# Patient Record
Sex: Female | Born: 1935 | Race: White | Hispanic: No | Smoking: Never smoker
Health system: Southern US, Community
[De-identification: ages and names within clinical notes are randomized; demographics above are authoritative.]

## PROBLEM LIST (undated history)

## (undated) DIAGNOSIS — I639 Cerebral infarction, unspecified: Secondary | ICD-10-CM

## (undated) DIAGNOSIS — R609 Edema, unspecified: Secondary | ICD-10-CM

## (undated) DIAGNOSIS — I10 Essential (primary) hypertension: Secondary | ICD-10-CM

## (undated) DIAGNOSIS — R600 Localized edema: Secondary | ICD-10-CM

## (undated) DIAGNOSIS — I209 Angina pectoris, unspecified: Secondary | ICD-10-CM

---

## 2004-05-15 ENCOUNTER — Ambulatory Visit (HOSPITAL_COMMUNITY): Admission: RE | Admit: 2004-05-15 | Discharge: 2004-05-15 | Payer: Self-pay | Admitting: Family Medicine

## 2006-04-15 ENCOUNTER — Ambulatory Visit (HOSPITAL_COMMUNITY): Admission: RE | Admit: 2006-04-15 | Discharge: 2006-04-15 | Payer: Self-pay | Admitting: Family Medicine

## 2008-05-26 ENCOUNTER — Encounter (HOSPITAL_COMMUNITY): Admission: RE | Admit: 2008-05-26 | Discharge: 2008-06-25 | Payer: Self-pay | Admitting: Internal Medicine

## 2010-08-30 ENCOUNTER — Ambulatory Visit: Payer: Self-pay | Admitting: Cardiology

## 2010-08-30 ENCOUNTER — Inpatient Hospital Stay (HOSPITAL_COMMUNITY): Admission: EM | Admit: 2010-08-30 | Discharge: 2010-09-01 | Payer: Self-pay | Admitting: Emergency Medicine

## 2010-09-01 ENCOUNTER — Encounter (INDEPENDENT_AMBULATORY_CARE_PROVIDER_SITE_OTHER): Payer: Self-pay | Admitting: Internal Medicine

## 2011-03-08 LAB — COMPREHENSIVE METABOLIC PANEL
ALT: 24 U/L (ref 0–35)
AST: 32 U/L (ref 0–37)
Alkaline Phosphatase: 87 U/L (ref 39–117)
BUN: 17 mg/dL (ref 6–23)
CO2: 32 mEq/L (ref 19–32)
Calcium: 8.5 mg/dL (ref 8.4–10.5)
Calcium: 8.9 mg/dL (ref 8.4–10.5)
Chloride: 103 mEq/L (ref 96–112)
GFR calc Af Amer: >60 mL/min (ref >60)
GFR calc Af Amer: >60 mL/min (ref >60)
Glucose, Bld: 135 mg/dL — ABNORMAL HIGH (ref 70–99)
Total Bilirubin: 1.1 mg/dL (ref 0.3–1.2)
Total Protein: 6.1 g/dL (ref 6.0–8.3)
Total Protein: 7 g/dL (ref 6.0–8.3)

## 2011-03-08 LAB — DIFFERENTIAL
Basophils Relative: 0 % (ref 0–1)
Eosinophils Absolute: 0.1 10*3/uL (ref 0.0–0.7)
Eosinophils Absolute: 0.1 10*3/uL (ref 0.0–0.7)
Eosinophils Relative: 2 % (ref 0–5)
Lymphocytes Relative: 32 % (ref 12–46)
Lymphocytes Relative: 33 % (ref 12–46)
Lymphs Abs: 1.4 10*3/uL (ref 0.7–4.0)
Lymphs Abs: 1.5 10*3/uL (ref 0.7–4.0)
Monocytes Relative: 11 % (ref 3–12)
Monocytes Relative: 9 % (ref 3–12)
Neutro Abs: 2.3 10*3/uL (ref 1.7–7.7)
Neutro Abs: 2.4 10*3/uL (ref 1.7–7.7)
Neutro Abs: 4.5 10*3/uL (ref 1.7–7.7)

## 2011-03-08 LAB — BASIC METABOLIC PANEL
CO2: 35 mEq/L — ABNORMAL HIGH (ref 19–32)
Calcium: 8.5 mg/dL (ref 8.4–10.5)
Chloride: 100 mEq/L (ref 96–112)
Creatinine, Ser: 0.83 mg/dL (ref 0.4–1.2)
GFR calc non Af Amer: >60 mL/min (ref >60)
Glucose, Bld: 103 mg/dL — ABNORMAL HIGH (ref 70–99)
Potassium: 4.1 mEq/L (ref 3.5–5.1)
Sodium: 141 mEq/L (ref 135–145)

## 2011-03-08 LAB — CARDIAC PANEL(CRET KIN+CKTOT+MB+TROPI)
CK, MB: 2.4 ng/mL (ref 0.3–4.0)
Relative Index: INVALID (ref 0.0–2.5)
Total CK: 31 U/L (ref 7–177)
Total CK: 40 U/L (ref 7–177)
Troponin I: 0.02 ng/mL (ref 0.00–0.06)

## 2011-03-08 LAB — URINALYSIS, ROUTINE W REFLEX MICROSCOPIC
Bilirubin Urine: NEGATIVE
Hgb urine dipstick: NEGATIVE
Ketones, ur: NEGATIVE mg/dL
Leukocytes, UA: NEGATIVE
Specific Gravity, Urine: 1.02 (ref 1.005–1.030)
pH: 5.5 (ref 5.0–8.0)

## 2011-03-08 LAB — CBC
HCT: 38.2 % (ref 36.0–46.0)
Hemoglobin: 11.6 g/dL — ABNORMAL LOW (ref 12.0–15.0)
Hemoglobin: 13 g/dL (ref 12.0–15.0)
MCH: 30.6 pg (ref 26.0–34.0)
MCHC: 34.2 g/dL (ref 30.0–36.0)
MCV: 89.5 fL (ref 78.0–100.0)
MCV: 90.5 fL (ref 78.0–100.0)
Platelets: 129 10*3/uL — ABNORMAL LOW (ref 150–400)
RBC: 3.84 MIL/uL — ABNORMAL LOW (ref 3.87–5.11)
RBC: 4.22 MIL/uL (ref 3.87–5.11)
WBC: 6.6 10*3/uL (ref 4.0–10.5)

## 2011-03-08 LAB — POCT CARDIAC MARKERS: Troponin i, poc: <0.05 ng/mL (ref 0.00–0.09)

## 2011-03-08 LAB — VANCOMYCIN, TROUGH: Vancomycin Tr: 18.1 ug/mL (ref 10.0–20.0)

## 2014-08-22 ENCOUNTER — Emergency Department (HOSPITAL_COMMUNITY): Payer: Medicare Other

## 2014-08-22 ENCOUNTER — Encounter (HOSPITAL_COMMUNITY): Payer: Self-pay | Admitting: Emergency Medicine

## 2014-08-22 ENCOUNTER — Inpatient Hospital Stay (HOSPITAL_COMMUNITY)
Admission: EM | Admit: 2014-08-22 | Discharge: 2014-08-24 | DRG: 066 | Disposition: A | Discharge status: Home Health | Payer: Medicare Other | Attending: Internal Medicine | Admitting: Internal Medicine

## 2014-08-22 DIAGNOSIS — F0390 Unspecified dementia without behavioral disturbance: Secondary | ICD-10-CM | POA: Diagnosis present

## 2014-08-22 DIAGNOSIS — I10 Essential (primary) hypertension: Secondary | ICD-10-CM | POA: Diagnosis present

## 2014-08-22 DIAGNOSIS — F4000 Agoraphobia, unspecified: Secondary | ICD-10-CM | POA: Diagnosis present

## 2014-08-22 DIAGNOSIS — G459 Transient cerebral ischemic attack, unspecified: Secondary | ICD-10-CM | POA: Diagnosis present

## 2014-08-22 DIAGNOSIS — I6350 Cerebral infarction due to unspecified occlusion or stenosis of unspecified cerebral artery: Principal | ICD-10-CM | POA: Diagnosis present

## 2014-08-22 DIAGNOSIS — R4182 Altered mental status, unspecified: Secondary | ICD-10-CM | POA: Diagnosis not present

## 2014-08-22 DIAGNOSIS — F411 Generalized anxiety disorder: Secondary | ICD-10-CM | POA: Diagnosis present

## 2014-08-22 DIAGNOSIS — I6340 Cerebral infarction due to embolism of unspecified cerebral artery: Secondary | ICD-10-CM | POA: Diagnosis present

## 2014-08-22 DIAGNOSIS — R1310 Dysphagia, unspecified: Secondary | ICD-10-CM | POA: Diagnosis present

## 2014-08-22 DIAGNOSIS — R600 Localized edema: Secondary | ICD-10-CM | POA: Diagnosis present

## 2014-08-22 DIAGNOSIS — I6389 Other cerebral infarction: Secondary | ICD-10-CM | POA: Diagnosis present

## 2014-08-22 DIAGNOSIS — R609 Edema, unspecified: Secondary | ICD-10-CM

## 2014-08-22 DIAGNOSIS — I4891 Unspecified atrial fibrillation: Secondary | ICD-10-CM | POA: Diagnosis present

## 2014-08-22 DIAGNOSIS — R4701 Aphasia: Secondary | ICD-10-CM | POA: Diagnosis present

## 2014-08-22 HISTORY — DX: Angina pectoris, unspecified: I20.9

## 2014-08-22 HISTORY — DX: Edema, unspecified: R60.9

## 2014-08-22 HISTORY — DX: Essential (primary) hypertension: I10

## 2014-08-22 HISTORY — DX: Localized edema: R60.0

## 2014-08-22 LAB — DIFFERENTIAL
Basophils Absolute: 0 10*3/uL (ref 0.0–0.1)
Basophils Relative: 0 % (ref 0–1)
Eosinophils Absolute: 0 10*3/uL (ref 0.0–0.7)
Eosinophils Relative: 1 % (ref 0–5)
Lymphocytes Relative: 41 % (ref 12–46)
Lymphs Abs: 1.7 10*3/uL (ref 0.7–4.0)
MONO ABS: 0.4 10*3/uL (ref 0.1–1.0)
MONOS PCT: 10 % (ref 3–12)
Neutro Abs: 2 10*3/uL (ref 1.7–7.7)
Neutrophils Relative %: 48 % (ref 43–77)

## 2014-08-22 LAB — RAPID URINE DRUG SCREEN, HOSP PERFORMED
AMPHETAMINES: NOT DETECTED
Barbiturates: NOT DETECTED
Benzodiazepines: POSITIVE — AB
COCAINE: NOT DETECTED
Opiates: NOT DETECTED
TETRAHYDROCANNABINOL: NOT DETECTED

## 2014-08-22 LAB — URINALYSIS, ROUTINE W REFLEX MICROSCOPIC
BILIRUBIN URINE: NEGATIVE
Glucose, UA: NEGATIVE mg/dL
Ketones, ur: NEGATIVE mg/dL
Leukocytes, UA: NEGATIVE
Nitrite: NEGATIVE
Protein, ur: NEGATIVE mg/dL
SPECIFIC GRAVITY, URINE: 1.01 (ref 1.005–1.030)
UROBILINOGEN UA: 0.2 mg/dL (ref 0.0–1.0)
pH: 6 (ref 5.0–8.0)

## 2014-08-22 LAB — COMPREHENSIVE METABOLIC PANEL
ALBUMIN: 4.3 g/dL (ref 3.5–5.2)
ALT: 23 U/L (ref 0–35)
ANION GAP: 12 (ref 5–15)
AST: 36 U/L (ref 0–37)
Alkaline Phosphatase: 132 U/L — ABNORMAL HIGH (ref 39–117)
BILIRUBIN TOTAL: 0.6 mg/dL (ref 0.3–1.2)
BUN: 17 mg/dL (ref 6–23)
CALCIUM: 9.5 mg/dL (ref 8.4–10.5)
CO2: 29 mEq/L (ref 19–32)
CREATININE: 0.7 mg/dL (ref 0.50–1.10)
Chloride: 98 mEq/L (ref 96–112)
GFR calc Af Amer: 85 mL/min — ABNORMAL LOW (ref >90)
GFR calc non Af Amer: 73 mL/min — ABNORMAL LOW (ref >90)
Glucose, Bld: 93 mg/dL (ref 70–99)
Potassium: 5.2 mEq/L (ref 3.7–5.3)
Sodium: 139 mEq/L (ref 137–147)
Total Protein: 7.8 g/dL (ref 6.0–8.3)

## 2014-08-22 LAB — APTT: aPTT: 31 seconds (ref 24–37)

## 2014-08-22 LAB — CBC
HCT: 38 % (ref 36.0–46.0)
HEMATOCRIT: 41.2 % (ref 36.0–46.0)
HEMOGLOBIN: 13.9 g/dL (ref 12.0–15.0)
HEMOGLOBIN: 13 g/dL (ref 12.0–15.0)
MCH: 30.7 pg (ref 26.0–34.0)
MCH: 31 pg (ref 26.0–34.0)
MCHC: 33.7 g/dL (ref 30.0–36.0)
MCHC: 34.2 g/dL (ref 30.0–36.0)
MCV: 90.9 fL (ref 78.0–100.0)
MCV: 90.5 fL (ref 78.0–100.0)
Platelets: 124 10*3/uL — ABNORMAL LOW (ref 150–400)
Platelets: 115 10*3/uL — ABNORMAL LOW (ref 150–400)
RBC: 4.53 MIL/uL (ref 3.87–5.11)
RBC: 4.2 MIL/uL (ref 3.87–5.11)
RDW: 14.1 % (ref 11.5–15.5)
RDW: 14 % (ref 11.5–15.5)
WBC: 4.1 10*3/uL (ref 4.0–10.5)
WBC: 4.3 10*3/uL (ref 4.0–10.5)

## 2014-08-22 LAB — I-STAT CHEM 8, ED
BUN: 17 mg/dL (ref 6–23)
Calcium, Ion: 1.15 mmol/L (ref 1.13–1.30)
Chloride: 103 mEq/L (ref 96–112)
Creatinine, Ser: 0.8 mg/dL (ref 0.50–1.10)
Glucose, Bld: 94 mg/dL (ref 70–99)
HCT: 46 % (ref 36.0–46.0)
HEMOGLOBIN: 15.6 g/dL — AB (ref 12.0–15.0)
Potassium: 4.9 mEq/L (ref 3.7–5.3)
SODIUM: 137 meq/L (ref 137–147)
TCO2: 29 mmol/L (ref 0–100)

## 2014-08-22 LAB — DIGOXIN LEVEL: DIGOXIN LVL: 1.2 ng/mL (ref 0.8–2.0)

## 2014-08-22 LAB — PROTIME-INR
INR: 0.98 (ref 0.00–1.49)
Prothrombin Time: 13 seconds (ref 11.6–15.2)

## 2014-08-22 LAB — CREATININE, SERUM
Creatinine, Ser: 0.72 mg/dL (ref 0.50–1.10)
GFR calc Af Amer: 84 mL/min — ABNORMAL LOW (ref >90)
GFR calc non Af Amer: 72 mL/min — ABNORMAL LOW (ref >90)

## 2014-08-22 LAB — GLUCOSE, CAPILLARY: GLUCOSE-CAPILLARY: 79 mg/dL (ref 70–99)

## 2014-08-22 LAB — URINE MICROSCOPIC-ADD ON

## 2014-08-22 LAB — CBG MONITORING, ED: Glucose-Capillary: 98 mg/dL (ref 70–99)

## 2014-08-22 LAB — I-STAT TROPONIN, ED: TROPONIN I, POC: 0 ng/mL (ref 0.00–0.08)

## 2014-08-22 LAB — ETHANOL

## 2014-08-22 MED ORDER — ACETAMINOPHEN 325 MG PO TABS
650.0000 mg | ORAL_TABLET | ORAL | Status: DC | PRN
  Administered 2014-08-22 – 2014-08-24 (×3): 650 mg via ORAL
  Filled 2014-08-22 (×3): qty 2

## 2014-08-22 MED ORDER — SODIUM CHLORIDE 0.9 % IV SOLN: 250.0000 mL | INTRAVENOUS | Status: DC | PRN

## 2014-08-22 MED ORDER — DIGOXIN 125 MCG PO TABS
0.2500 mg | ORAL_TABLET | Freq: Every day | ORAL | Status: DC
  Administered 2014-08-22 – 2014-08-23 (×2): 0.25 mg via ORAL
  Filled 2014-08-22 (×2): qty 2

## 2014-08-22 MED ORDER — SODIUM CHLORIDE 0.9 % IJ SOLN: 3.0000 mL | INTRAMUSCULAR | Status: DC | PRN

## 2014-08-22 MED ORDER — STROKE: EARLY STAGES OF RECOVERY BOOK
Freq: Once | Status: DC
  Filled 2014-08-22: qty 1

## 2014-08-22 MED ORDER — FUROSEMIDE 40 MG PO TABS
40.0000 mg | ORAL_TABLET | Freq: Two times a day (BID) | ORAL | Status: DC
  Administered 2014-08-22 – 2014-08-23 (×2): 40 mg via ORAL
  Filled 2014-08-22 (×2): qty 1

## 2014-08-22 MED ORDER — RAMIPRIL 2.5 MG PO CAPS
10.0000 mg | ORAL_CAPSULE | Freq: Every day | ORAL | Status: DC
  Administered 2014-08-22 – 2014-08-23 (×2): 10 mg via ORAL
  Filled 2014-08-22 (×2): qty 4

## 2014-08-22 MED ORDER — ISRADIPINE 2.5 MG PO CAPS
10.0000 mg | ORAL_CAPSULE | Freq: Every day | ORAL | Status: DC
  Administered 2014-08-22 – 2014-08-23 (×2): 10 mg via ORAL
  Filled 2014-08-22: qty 4
  Filled 2014-08-22: qty 2
  Filled 2014-08-22: qty 4

## 2014-08-22 MED ORDER — LORAZEPAM 1 MG PO TABS
0.5000 mg | ORAL_TABLET | Freq: Once | ORAL | Status: AC
  Administered 2014-08-22: 0.5 mg via ORAL
  Filled 2014-08-22: qty 1

## 2014-08-22 MED ORDER — ENOXAPARIN SODIUM 40 MG/0.4ML ~~LOC~~ SOLN: 40.0000 mg | SUBCUTANEOUS | Status: DC

## 2014-08-22 MED ORDER — ISRADIPINE 2.5 MG PO CAPS
ORAL_CAPSULE | ORAL | Status: AC
  Filled 2014-08-22: qty 4

## 2014-08-22 MED ORDER — CLORAZEPATE DIPOTASSIUM 7.5 MG PO TABS
15.0000 mg | ORAL_TABLET | Freq: Two times a day (BID) | ORAL | Status: DC | PRN
  Administered 2014-08-22 – 2014-08-23 (×2): 15 mg via ORAL
  Filled 2014-08-22 (×2): qty 2

## 2014-08-22 MED ORDER — ASPIRIN 325 MG PO TABS
325.0000 mg | ORAL_TABLET | Freq: Every day | ORAL | Status: DC
  Administered 2014-08-23: 325 mg via ORAL
  Filled 2014-08-22 (×2): qty 1

## 2014-08-22 MED ORDER — SODIUM CHLORIDE 0.9 % IJ SOLN
3.0000 mL | Freq: Two times a day (BID) | INTRAMUSCULAR | Status: DC
  Administered 2014-08-22 – 2014-08-24 (×4): 3 mL via INTRAVENOUS

## 2014-08-22 MED ORDER — ENOXAPARIN SODIUM 30 MG/0.3ML ~~LOC~~ SOLN
30.0000 mg | SUBCUTANEOUS | Status: DC
  Administered 2014-08-22 – 2014-08-23 (×2): 30 mg via SUBCUTANEOUS
  Filled 2014-08-22 (×2): qty 0.3

## 2014-08-22 NOTE — ED Provider Notes (Addendum)
CSN: [id number]     Arrival date & time 08/22/14  1201 History  This chart was scribed for Glynn Octave, MD by Elon Spanner, ED Scribe. This patient was seen in room APA19/APA19 and the patient's care was started at 12:21 PM.    Chief Complaint  Patient presents with  . Altered Mental Status   LEVEL 5 CAVEAT (ALTERED MENTAL STATE)  The history is provided by a relative and the patient. No language interpreter was used.   HPI Comments: Amber Hodges is a 80 y.o. female brought in by ambulance, who presents to the Emergency Department complaining of acute onset altered mental state onset 1.5-2 hours ago.  Per family, the patient is highly functioning at baseline and was behaving normally this morning between 9 and 10:00 am.  Her current disorientation, nonsensical speech, and aphasia is new and was first noticed at approximately 11:00 am.  Family denies patient has had cough, recent illness, recent sick contancts.  Patient denies any pain.     Past Medical History  Diagnosis Date  . Angina pectoris   . Hypertension   . Peripheral edema    History reviewed. No pertinent past surgical history. History reviewed. No pertinent family history. History  Substance Use Topics  . Smoking status: Never Smoker   . Smokeless tobacco: Never Used  . Alcohol Use: No   OB History   Grav Para Term Preterm Abortions TAB SAB Ect Mult Living                 Review of Systems  Unable to perform ROS: Mental status change      Allergies  Review of patient's allergies indicates no known allergies.  Home Medications   Prior to Admission medications   Medication Sig Start Date End Date Taking? Authorizing Provider  clorazepate (TRANXENE) 15 MG tablet Take 15 mg by mouth 2 (two) times daily.   Yes Historical Provider, MD  digoxin (LANOXIN) 0.25 MG tablet Take 0.25 mg by mouth daily.   Yes Historical Provider, MD  isradipine (DYNACIRC) 5 MG capsule Take 10 mg by mouth daily.   Yes  Historical Provider, MD  potassium chloride (K-DUR) 10 MEQ tablet Take 10 mEq by mouth 2 (two) times daily.   Yes Historical Provider, MD  ramipril (ALTACE) 10 MG capsule Take 10 mg by mouth daily.   Yes Historical Provider, MD   BP 147/63  Pulse 72  Temp(Src) 97.4 F (36.3 C) (Oral)  Resp 20  Ht 5' (1.524 m)  Wt 96 lb 12.5 oz (43.9 kg)  BMI 18.90 kg/m2  SpO2 94% Physical Exam  Nursing note and vitals reviewed. Constitutional: She appears well-developed and well-nourished. No distress.  Confused.  Intermittent receptive aphasia  HENT:  Head: Normocephalic and atraumatic.  Mouth/Throat: Oropharynx is clear and moist. No oropharyngeal exudate.  Eyes: Conjunctivae and EOM are normal. Pupils are equal, round, and reactive to light.  Neck: Normal range of motion. Neck supple.  No meningismus.  Cardiovascular: Normal rate, regular rhythm, normal heart sounds and intact distal pulses.   No murmur heard. Pulmonary/Chest: Effort normal and breath sounds normal. No respiratory distress.  Abdominal: Soft. There is no tenderness. There is no rebound and no guarding.  Musculoskeletal: Normal range of motion. She exhibits no edema and no tenderness.  Slight erythema and swelling to right lower leg.    Neurological: She is alert. No cranial nerve deficit. She exhibits normal muscle tone. Coordination normal.  Oriented x 1.  Word-finding difficulty  with nonsensical speech. CN II-XII intact.  Equal grip strength bilaterally. R lower extremity weakness at baseline per daughter.  Follows some commands intermittently.    Skin: Skin is warm.  Psychiatric: She has a normal mood and affect. Her behavior is normal.    ED Course  Procedures (including critical care time)  DIAGNOSTIC STUDIES: Oxygen Saturation is 100% on RA, normal by my interpretation.    COORDINATION OF CARE:  12:30 PM Discussed treatment plan with patient at bedside.  Patient acknowledges and agrees with plan.    2:17 PM Upon  recheck, patient's condition is unchanged.  Informed patient of need to remain in the hospital to continue monitoring.  Labs Review Labs Reviewed  CBC - Abnormal; Notable for the following:    Platelets 124 (*)    All other components within normal limits  COMPREHENSIVE METABOLIC PANEL - Abnormal; Notable for the following:    Alkaline Phosphatase 132 (*)    GFR calc non Af Amer 73 (*)    GFR calc Af Amer 85 (*)    All other components within normal limits  URINE RAPID DRUG SCREEN (HOSP PERFORMED) - Abnormal; Notable for the following:    Benzodiazepines POSITIVE (*)    All other components within normal limits  URINALYSIS, ROUTINE W REFLEX MICROSCOPIC - Abnormal; Notable for the following:    Hgb urine dipstick TRACE (*)    All other components within normal limits  I-STAT CHEM 8, ED - Abnormal; Notable for the following:    Hemoglobin 15.6 (*)    All other components within normal limits  ETHANOL  PROTIME-INR  APTT  DIFFERENTIAL  DIGOXIN LEVEL  URINE MICROSCOPIC-ADD ON  TROPONIN I  I-STAT TROPOININ, ED  I-STAT TROPOININ, ED  CBG MONITORING, ED    Imaging Review Dg Chest 1 View  08/22/2014   CLINICAL DATA:  80 year old female with altered mental status. Initial encounter.  EXAM: CHEST - 1 VIEW  COMPARISON:  08/30/2010.  FINDINGS: Frontal view the chest at 1310 hrs. The patient is rotated to the right similar to the prior study. Chronic cardiomegaly not significantly changed. Chronic tortuosity of the thoracic aorta also appears stable. Other mediastinal contours are within normal limits. Visualized tracheal air column is within normal limits. Regressed bilateral perihilar opacity. No pneumothorax or pulmonary edema. No pleural effusion or definite consolidation. Osteopenia.  IMPRESSION: Chronic cardiomegaly.  *INSERT* Chest   Electronically Signed   By: Augusto Gamble M.D.   On: 08/22/2014 13:47   Ct Head Wo Contrast  08/22/2014   CLINICAL DATA:  Altered mental status .  EXAM: CT  HEAD WITHOUT CONTRAST  TECHNIQUE: Contiguous axial images were obtained from the base of the skull through the vertex without intravenous contrast.  COMPARISON:  None.  FINDINGS: No intra-axial or extra-axial pathologic fluid or blood collection. No mass. No hydrocephalus. Diffuse severe cerebral and cerebellar atrophy. Benign basal ganglia calcification. No acute bony abnormality. Mucous retention cyst right maxillary sinus.  IMPRESSION: No acute abnormality. Diffuse severe cerebral and cerebellar atrophy. White matter changes consistent with chronic ischemic .   Electronically Signed   By: Maisie Fus  Register   On: 08/22/2014 13:48     EKG Interpretation None      MDM   Final diagnoses:  Altered mental status, unspecified altered mental status type   patient from home with acute change in mental status with nonsensical speech and apparent receptive aphasia. Son states she was normal at 10 AM. At baseline she is alert and oriented  x3 and follows commands. She is oriented only x1 and otherwise has word salad and nonsensical speech.   CT negative for hemorrhage.  UA negative, CXR negative. UDS with benzos, which she is prescribed.  EKG with junctional rhythm versus atrial fibrillation. Biphasic T waves v4 and v5.  Troponin negative.  Neurology consult complete. Patient oriented x1 with word salad and receptive aphasia. Uncertain last known well as son briefly spoke to her on the phone around 10 AM.  Extensive discussion with Dr. Harl Favor of neurology. He agrees patient has receptive aphasia which could be seen with a stroke. Her NIH scale is 3. However her time of onset is unclear.  It appears her last known well was between 9 and 10 am.  She arrived to the ED at noon, putting her just out of the safe therapeutic window.  And multiple other etiologies could explain her confusion. He favors altered mental status workup and does not recommend TPA. D/w family who is in agreement.  Low NIH scale, advanced  age, unclear time of onset, multiple other possible etiologies for AMS, suggest tPA not indicated.   Will admit for ongoing AMS, CVA ruleout.  D/w Dr. Kerry Hough.    I personally performed the services described in this documentation, which was scribed in my presence. The recorded information has been reviewed and is accurate.   Glynn Octave, MD 08/22/14 Izola Price  Glynn Octave, MD 08/24/14 [phone]

## 2014-08-22 NOTE — H&P (Signed)
[facility] History and Physical  Amber Hodges ZOX:[id number] DOB: 08-11-1919 DOA: 08/22/2014  Referring physician: Dr. Manus Gunning, ER physician PCP: Cassell Smiles., MD   Chief Complaint: confusion  HPI: Amber Hodges is a 80 y.o. female history of hypertension, possible atrial fibrillation, anxiety who was in her usual state of health when her family noted that this morning she was increasingly confused and was speaking gibberish. She was last seen normal yesterday. Her daughter had spoken to her on the phone this morning for a short period of time and felt that she was normal. Within a few hours, she spoke to another daughter who felt she was increasingly confused. She was brought by her son to the emergency room. It was felt that she had a receptive aphasia. She was seen by tele neurology, who felt that patient may have had a TIA. He recommended admission for further workup. Urinalysis does not show any infections. She does not have any fever or dysuria. She's had no vomiting, diarrhea, chest pain, shortness of breath, cough or any other symptoms.   Review of Systems:  Pertinent positives as per HPI, otherwise negative  Past Medical History  Diagnosis Date  . Angina pectoris   . Hypertension   . Peripheral edema    History reviewed. No pertinent past surgical history. Social History:  reports that she has never smoked. She has never used smokeless tobacco. She reports that she does not drink alcohol or use illicit drugs.  No Known Allergies  History reviewed. No pertinent family history.   Prior to Admission medications   Medication Sig Start Date End Date Taking? Authorizing Provider  clorazepate (TRANXENE) 15 MG tablet Take 15 mg by mouth 2 (two) times daily.   Yes Historical Provider, MD  digoxin (LANOXIN) 0.25 MG tablet Take 0.25 mg by mouth daily.   Yes Historical Provider, MD  isradipine (DYNACIRC) 5 MG capsule Take 10 mg by mouth daily.   Yes Historical  Provider, MD  potassium chloride (K-DUR) 10 MEQ tablet Take 10 mEq by mouth 2 (two) times daily.   Yes Historical Provider, MD  ramipril (ALTACE) 10 MG capsule Take 10 mg by mouth daily.   Yes Historical Provider, MD   Physical Exam: Filed Vitals:   08/22/14 1600 08/22/14 1630 08/22/14 1700 08/22/14 1800  BP: 160/119 167/68 149/69 147/63  Pulse: 67   72  Temp:    97.4 F (36.3 C)  TempSrc:    Oral  Resp: Height:    5' (1.524 m)  Weight:    43.9 kg (96 lb 12.5 oz)  SpO2: 97%   94%    Wt Readings from Last 3 Encounters:  08/22/14 43.9 kg (96 lb 12.5 oz)    General:  Confused, does not appear to be in distress, can occasionally follow commands Eyes: PERRL, normal lids, irises & conjunctiva ENT: grossly normal hearing, lips & tongue Neck: no LAD, masses or thyromegaly Cardiovascular: RRR, no m/r/g. 1+ edema. Telemetry: SR, no arrhythmias  Respiratory: CTA bilaterally, no w/r/r. Normal respiratory effort. Abdomen: soft, ntnd Skin: redness over right lower extremity Musculoskeletal: grossly normal tone BUE/BLE Psychiatric: confused Neurologic: has chronic right leg weakness, no other gross motor deficits          Labs on Admission:  Basic Metabolic Panel:  Recent Labs Lab 08/22/14 1238 08/22/14 1243  NA 139 137  K 5.2 4.9  CL 98 103  CO2 29  --   GLUCOSE 93 94  BUN 17 17  CREATININE 0.70 0.80  CALCIUM 9.5  --    Liver Function Tests:  Recent Labs Lab 08/22/14 1238  AST 36  ALT 23  ALKPHOS 132*  BILITOT 0.6  PROT 7.8  ALBUMIN 4.3   No results found for this basename: LIPASE, AMYLASE,  in the last 168 hours No results found for this basename: AMMONIA,  in the last 168 hours CBC:  Recent Labs Lab 08/22/14 1238 08/22/14 1243  WBC 4.1  --   NEUTROABS 2.0  --   HGB 13.9 15.6*  HCT 41.2 46.0  MCV 90.9  --   PLT 124*  --    Cardiac Enzymes: No results found for this basename: CKTOTAL, CKMB, CKMBINDEX, TROPONINI,  in the last 168  hours  BNP (last 3 results) No results found for this basename: PROBNP,  in the last 8760 hours CBG:  Recent Labs Lab 08/22/14 1250  GLUCAP 98    Radiological Exams on Admission: Dg Chest 1 View  08/22/2014   CLINICAL DATA:  80 year old female with altered mental status. Initial encounter.  EXAM: CHEST - 1 VIEW  COMPARISON:  08/30/2010.  FINDINGS: Frontal view the chest at 1310 hrs. The patient is rotated to the right similar to the prior study. Chronic cardiomegaly not significantly changed. Chronic tortuosity of the thoracic aorta also appears stable. Other mediastinal contours are within normal limits. Visualized tracheal air column is within normal limits. Regressed bilateral perihilar opacity. No pneumothorax or pulmonary edema. No pleural effusion or definite consolidation. Osteopenia.  IMPRESSION: Chronic cardiomegaly.  *INSERT* Chest   Electronically Signed   By: Augusto Gamble M.D.   On: 08/22/2014 13:47   Ct Head Wo Contrast  08/22/2014   CLINICAL DATA:  Altered mental status .  EXAM: CT HEAD WITHOUT CONTRAST  TECHNIQUE: Contiguous axial images were obtained from the base of the skull through the vertex without intravenous contrast.  COMPARISON:  None.  FINDINGS: No intra-axial or extra-axial pathologic fluid or blood collection. No mass. No hydrocephalus. Diffuse severe cerebral and cerebellar atrophy. Benign basal ganglia calcification. No acute bony abnormality. Mucous retention cyst right maxillary sinus.  IMPRESSION: No acute abnormality. Diffuse severe cerebral and cerebellar atrophy. White matter changes consistent with chronic ischemic .   Electronically Signed   By: Maisie Fus  Register   On: 08/22/2014 13:48    EKG: Independently reviewed. Junctional rhythm  Assessment/Plan Principal Problem:   Altered mental status Active Problems:   Receptive aphasia   Essential hypertension, benign   Atrial fibrillation   Bilateral lower extremity edema   1. Altered mental status.  Etiology is not entirely clear. Possibly related to TIA. She had CT head which was unremarkable. Her daughter reports that she is extremely claustrophobic and may not be will tolerate an MRI. We'll request neurology consultation to discuss further workup options. Further workup with echocardiogram and carotid Dopplers have been ordered. We'll continue her on aspirin. She's not been started on any new medications. Infection appears to be less likely. Await further neurology input 2. Atrial fibrillation. She is currently on digoxin. Digoxin level is therapeutic. Her daughter reports that she only takes an aspirin is not on anticoagulation. She may pose a significant fall risk. 3. Bilateral lower extremity edema. Patient is chronically on Lasix. Her daughter reports occasional noncompliance since the patient does not like to use bathroom. We'll continue her on her usual dosing. 4. Anxiety. Continue her normal clorazepate dosing. 5. Hypertension. Continue outpatient regimen  Code Status: full code  DVT Prophylaxis: lovenox Family Communication: discussed at length with daughter Disposition Plan: discharge home once improved  Time spent:  [facility] Pager [phone]  **Disclaimer: This note may have been dictated with voice recognition software. Similar sounding words can inadvertently be transcribed and this note may contain transcription errors which may not have been corrected upon publication of note.**

## 2014-08-22 NOTE — ED Notes (Signed)
Pt/daughter aware awaiting d/c from inpatient and may be in ED for a while. Nad.

## 2014-08-22 NOTE — ED Notes (Addendum)
Per EMS, pt talking nonsensically.  Son called EMS b/c he was worried.  Vitals stable.  At baseline, son states she is normal.  EMS reports difficulty walking, but otherwise alert although confused.  Pt on potassium and digoxin.  VS 160/80, HR 67, O2 95, CBG 108.    Per pt's son, pt was normal at 10am.  When son went to pt's house at 11am, she was acting strange.  Son states she is normally alert, oriented, and lucid.  States pt's voice was not slurred @ 11am, but she was not making sense.

## 2014-08-22 NOTE — ED Notes (Signed)
Tele-Neurology physician talking with pt.

## 2014-08-23 ENCOUNTER — Inpatient Hospital Stay (HOSPITAL_COMMUNITY): Payer: Medicare Other

## 2014-08-23 DIAGNOSIS — I359 Nonrheumatic aortic valve disorder, unspecified: Secondary | ICD-10-CM

## 2014-08-23 DIAGNOSIS — G459 Transient cerebral ischemic attack, unspecified: Secondary | ICD-10-CM | POA: Diagnosis present

## 2014-08-23 DIAGNOSIS — I10 Essential (primary) hypertension: Secondary | ICD-10-CM | POA: Diagnosis present

## 2014-08-23 LAB — GLUCOSE, CAPILLARY
Glucose-Capillary: 81 mg/dL (ref 70–99)
Glucose-Capillary: 116 mg/dL — ABNORMAL HIGH (ref 70–99)
Glucose-Capillary: 99 mg/dL (ref 70–99)
Glucose-Capillary: 83 mg/dL (ref 70–99)

## 2014-08-23 LAB — LIPID PANEL
CHOL/HDL RATIO: 3.4 ratio
CHOLESTEROL: 169 mg/dL (ref 0–200)
HDL: 49 mg/dL (ref >39)
LDL Cholesterol: 98 mg/dL (ref 0–99)
Triglycerides: 112 mg/dL (ref <150)
VLDL: 22 mg/dL (ref 0–40)

## 2014-08-23 LAB — HEMOGLOBIN A1C
Hgb A1c MFr Bld: 5.2 % (ref <5.7)
Mean Plasma Glucose: 103 mg/dL (ref <117)

## 2014-08-23 MED ORDER — FUROSEMIDE 40 MG PO TABS
40.0000 mg | ORAL_TABLET | Freq: Two times a day (BID) | ORAL | Status: DC
  Administered 2014-08-23 – 2014-08-24 (×2): 40 mg via ORAL
  Filled 2014-08-23 (×2): qty 1

## 2014-08-23 NOTE — Consult Note (Signed)
Amber A. [name], MD     [email or web address]          Amber Hodges is an 80 y.o. female.   ASSESSMENT/PLAN: 1. Acute onset of confusion associated with aphasia. The patient most likely has a left cortical hemispheric infarct due to atrial fibrillation.  An extensive discussion was done with the patient's daughter. I believe that it is relatively safe and that the benefits supersedes the risk of placing the patient on anticoagulation. There are significant issues with mobility of the patient in that she seems to have a agoraphobia and stays home without getting out of the home. Therefore monitoring with warfarin would be a significant limitation. Consequent, one of the newer agents is suggested. I suggest eliquis 2.5 mg twice a day. The brain CT scan will be obtained. Timing of initiation of anticoagulation will depend on the CT scan and the size of the infarct. She has a smaller moderate infarct, we could start anticoagulation right away. If she has a larger infarct, we would suggest that she hold off for 2 weeks before starting anticoagulation to minimize the risk of hemorrhagic transformation. Speech therapy is suggested. 2. Extensive nonhemodynamically plaque involving the common carotid artery. This should be managed medically.   The patient is a 80 year old white female who presents with the acute onset of confusion, altered mental status and difficulty speaking. The patient children check in on her throughout the day. She does live by herself but has a sitter who stays from a dental for during the weekdays. On Sunday she was noted to be normal at 9 and 10 AM. However, at 11 when another daughter called the patient. She is noted to be confused and having difficulty speaking. The patient was taken to the emergency room for further evaluation by another son. It appears she was not given TPA because of timing issues and being outside of the therapeutic window. The  patient has improved by the daughter who is at the bedside today stating that she clearly has significant difficulties communicating and is not baseline. The patient does have a history ofAtrial fibrillation. She has been on aspirin but not on anticoagulation because of concerns of the patient's gait. 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 She has done fairly well with this but did have some difficulties getting around due to swelling of her legs. The daughter tells me that she has not had any falls recently since she has been on Lasix and uses a walker to get around. She has an aide which helps her daytime. Last fall may have been about a year ago. The patient's husband was on warfarin and the patient swears that she would never be on this medication because of all the monitoring associated with it. The daughter also reports that the patient seemed to have significant issues with agoraphobia. Review of systems is very limited due to the significant confusion and aphasia noted. GENERAL: This is a pleasant thin female in no acute distress.  HEENT: Supple. Atraumatic normocephalic.   ABDOMEN: soft  EXTREMITIES: No edema   BACK: Normal.  SKIN: Normal by inspection.    MENTAL STATUS: She is awake and alert but clearly has a significant receptive aphasia. She has significant difficulties following commands. She confabulates quite a bit. She has significant paraphasic errors and difficulty in naming. She did only able to name 2 out of 5 objects and even  then with significant difficulties. Much prompting is required for the evaluation.  CRANIAL NERVES: Pupils are equal, round and reactive to light and accommodation; extra ocular movements are full, there is no significant nystagmus; visual fields are full; upper and lower facial muscles are normal in strength and symmetric, there is no flattening of the nasolabial folds;  tongue is midline; uvula is midline; shoulder elevation is normal.  MOTOR: Difficult due to the aphasia. However, she seemed to have at least 4/5 strength involving the upper extremities and the left leg. Bulk and tone are normal. The right lower extremity is significantly paretic being 2/5 and associated with increased tone.  COORDINATION: Left finger to nose is normal, right finger to nose is normal, No rest tremor; no intention tremor; no postural tremor; no bradykinesia.  REFLEXES: Deep tendon reflexes are symmetrical and normal. Babinski reflexes are flexor bilaterally.   SENSATION: Normal to light touch.         Past Medical History  Diagnosis Date  . Angina pectoris   . Hypertension   . Peripheral edema     History reviewed. No pertinent past surgical history.  History reviewed. No pertinent family history.  Social History:  reports that she has never smoked. She has never used smokeless tobacco. She reports that she does not drink alcohol or use illicit drugs.  Allergies: No Known Allergies  Medications: Prior to Admission medications   Medication Sig Start Date End Date Taking? Authorizing Provider  clorazepate (TRANXENE) 15 MG tablet Take 15 mg by mouth 2 (two) times daily.   Yes Historical Provider, MD  digoxin (LANOXIN) 0.25 MG tablet Take 0.25 mg by mouth daily.   Yes Historical Provider, MD  isradipine (DYNACIRC) 5 MG capsule Take 10 mg by mouth daily.   Yes Historical Provider, MD  potassium chloride (K-DUR) 10 MEQ tablet Take 10 mEq by mouth 2 (two) times daily.   Yes Historical Provider, MD  ramipril (ALTACE) 10 MG capsule Take 10 mg by mouth daily.   Yes Historical Provider, MD    Scheduled Meds: .  stroke: mapping our early stages of recovery book   Does not apply Once  . aspirin  325 mg Oral Daily  . digoxin  0.25 mg Oral Daily  . enoxaparin (LOVENOX) injection  30 mg Subcutaneous Q24H  . furosemide  40 mg Oral BID  . isradipine  10 mg Oral Daily    . ramipril  10 mg Oral Daily  . sodium chloride  3 mL Intravenous Q12H   Continuous Infusions:  PRN Meds:.sodium chloride, acetaminophen, clorazepate, sodium chloride   Blood pressure 143/58, pulse 64, temperature 97.6 F (36.4 C), temperature source Oral, resp. rate 18, height 5' (1.524 m), weight 43.9 kg (96 lb 12.5 oz), SpO2 97.00%.   Results for orders placed during the hospital encounter of 08/22/14 (from the past 48 hour(s))  ETHANOL     Status: None   Collection Time    08/22/14 12:38 PM      Result Value Ref Range   Alcohol, Ethyl (B) <11  0 - 11 mg/dL   Comment:            LOWEST DETECTABLE LIMIT FOR     SERUM ALCOHOL IS 11 mg/dL     FOR MEDICAL PURPOSES ONLY  PROTIME-INR     Status: None   Collection Time    08/22/14 12:38 PM      Result Value Ref Range   Prothrombin Time 13.0  11.6 - 15.2  seconds   INR 0.98  0.00 - 1.49  APTT     Status: None   Collection Time    08/22/14 12:38 PM      Result Value Ref Range   aPTT 31  24 - 37 seconds  CBC     Status: Abnormal   Collection Time    08/22/14 12:38 PM      Result Value Ref Range   WBC 4.1  4.0 - 10.5 K/uL   RBC 4.53  3.87 - 5.11 MIL/uL   Hemoglobin 13.9  12.0 - 15.0 g/dL   HCT 41.2  36.0 - 46.0 %   MCV 90.9  78.0 - 100.0 fL   MCH 30.7  26.0 - 34.0 pg   MCHC 33.7  30.0 - 36.0 g/dL   RDW 14.1  11.5 - 15.5 %   Platelets 124 (*) 150 - 400 K/uL  DIFFERENTIAL     Status: None   Collection Time    08/22/14 12:38 PM      Result Value Ref Range   Neutrophils Relative % 48  43 - 77 %   Neutro Abs 2.0  1.7 - 7.7 K/uL   Lymphocytes Relative 41  12 - 46 %   Lymphs Abs 1.7  0.7 - 4.0 K/uL   Monocytes Relative 10  3 - 12 %   Monocytes Absolute 0.4  0.1 - 1.0 K/uL   Eosinophils Relative 1  0 - 5 %   Eosinophils Absolute 0.0  0.0 - 0.7 K/uL   Basophils Relative 0  0 - 1 %   Basophils Absolute 0.0  0.0 - 0.1 K/uL  COMPREHENSIVE METABOLIC PANEL     Status: Abnormal   Collection Time    08/22/14 12:38 PM       Result Value Ref Range   Sodium 139  137 - 147 mEq/L   Potassium 5.2  3.7 - 5.3 mEq/L   Chloride 98  96 - 112 mEq/L   CO2 29  19 - 32 mEq/L   Glucose, Bld 93  70 - 99 mg/dL   BUN 17  6 - 23 mg/dL   Creatinine, Ser 0.70  0.50 - 1.10 mg/dL   Calcium 9.5  8.4 - 10.5 mg/dL   Total Protein 7.8  6.0 - 8.3 g/dL   Albumin 4.3  3.5 - 5.2 g/dL   AST 36  0 - 37 U/L   ALT 23  0 - 35 U/L   Alkaline Phosphatase 132 (*) 39 - 117 U/L   Total Bilirubin 0.6  0.3 - 1.2 mg/dL   GFR calc non Af Amer 73 (*) >90 mL/min   GFR calc Af Amer 85 (*) >90 mL/min   Comment: (NOTE)     The eGFR has been calculated using the CKD EPI equation.     This calculation has not been validated in all clinical situations.     eGFR's persistently <90 mL/min signify possible Chronic Kidney     Disease.   Anion gap 12  5 - 15  DIGOXIN LEVEL     Status: None   Collection Time    08/22/14 12:38 PM      Result Value Ref Range   Digoxin Level 1.2  0.8 - 2.0 ng/mL  I-STAT TROPOININ, ED     Status: None   Collection Time    08/22/14 12:40 PM      Result Value Ref Range   Troponin i, poc 0.00  0.00 - 0.08 ng/mL  Comment 3            Comment: Due to the release kinetics of cTnI,     a negative result within the first hours     of the onset of symptoms does not rule out     myocardial infarction with certainty.     If myocardial infarction is still suspected,     repeat the test at appropriate intervals.  I-STAT CHEM 8, ED     Status: Abnormal   Collection Time    08/22/14 12:43 PM      Result Value Ref Range   Sodium 137  137 - 147 mEq/L   Potassium 4.9  3.7 - 5.3 mEq/L   Chloride 103  96 - 112 mEq/L   BUN 17  6 - 23 mg/dL   Creatinine, Ser 0.80  0.50 - 1.10 mg/dL   Glucose, Bld 94  70 - 99 mg/dL   Calcium, Ion 1.15  1.13 - 1.30 mmol/L   TCO2 29  0 - 100 mmol/L   Hemoglobin 15.6 (*) 12.0 - 15.0 g/dL   HCT 46.0  36.0 - 46.0 %  URINE RAPID DRUG SCREEN (HOSP PERFORMED)     Status: Abnormal   Collection Time     08/22/14 12:49 PM      Result Value Ref Range   Opiates NONE DETECTED  NONE DETECTED   Cocaine NONE DETECTED  NONE DETECTED   Benzodiazepines POSITIVE (*) NONE DETECTED   Amphetamines NONE DETECTED  NONE DETECTED   Tetrahydrocannabinol NONE DETECTED  NONE DETECTED   Barbiturates NONE DETECTED  NONE DETECTED   Comment:            DRUG SCREEN FOR MEDICAL PURPOSES     ONLY.  IF CONFIRMATION IS NEEDED     FOR ANY PURPOSE, NOTIFY LAB     WITHIN 5 DAYS.                LOWEST DETECTABLE LIMITS     FOR URINE DRUG SCREEN     Drug Class       Cutoff (ng/mL)     Amphetamine      1000     Barbiturate      200     Benzodiazepine   893     Tricyclics       734     Opiates          300     Cocaine          300     THC              50  URINALYSIS, ROUTINE W REFLEX MICROSCOPIC     Status: Abnormal   Collection Time    08/22/14 12:49 PM      Result Value Ref Range   Color, Urine YELLOW  YELLOW   APPearance CLEAR  CLEAR   Specific Gravity, Urine 1.010  1.005 - 1.030   pH 6.0  5.0 - 8.0   Glucose, UA NEGATIVE  NEGATIVE mg/dL   Hgb urine dipstick TRACE (*) NEGATIVE   Bilirubin Urine NEGATIVE  NEGATIVE   Ketones, ur NEGATIVE  NEGATIVE mg/dL   Protein, ur NEGATIVE  NEGATIVE mg/dL   Urobilinogen, UA 0.2  0.0 - 1.0 mg/dL   Nitrite NEGATIVE  NEGATIVE   Leukocytes, UA NEGATIVE  NEGATIVE  URINE MICROSCOPIC-ADD ON     Status: None   Collection Time    08/22/14 12:49 PM  Result Value Ref Range   RBC / HPF 0-2  <3 RBC/hpf  CBG MONITORING, ED     Status: None   Collection Time    08/22/14 12:50 PM      Result Value Ref Range   Glucose-Capillary 98  70 - 99 mg/dL  HEMOGLOBIN A1C     Status: None   Collection Time    08/22/14  7:26 PM      Result Value Ref Range   Hemoglobin A1C 5.2  <5.7 %   Comment: (NOTE)                                                                               According to the ADA Clinical Practice Recommendations for 2011, when     HbA1c is used as a screening  test:      >=6.5%   Diagnostic of Diabetes Mellitus               (if abnormal result is confirmed)     5.7-6.4%   Increased risk of developing Diabetes Mellitus     References:Diagnosis and Classification of Diabetes Mellitus,Diabetes     FIEP,3295,18(ACZYS 1):S62-S69 and Standards of Medical Care in             Diabetes - 2011,Diabetes Care,2011,34 (Suppl 1):S11-S61.   Mean Plasma Glucose 103  <117 mg/dL   Comment: Performed at [facility]  CBC     Status: Abnormal   Collection Time    08/22/14  7:26 PM      Result Value Ref Range   WBC 4.3  4.0 - 10.5 K/uL   RBC 4.20  3.87 - 5.11 MIL/uL   Hemoglobin 13.0  12.0 - 15.0 g/dL   HCT 38.0  36.0 - 46.0 %   MCV 90.5  78.0 - 100.0 fL   MCH 31.0  26.0 - 34.0 pg   MCHC 34.2  30.0 - 36.0 g/dL   RDW 14.0  11.5 - 15.5 %   Platelets 115 (*) 150 - 400 K/uL   Comment: SPECIMEN CHECKED FOR CLOTS  CREATININE, SERUM     Status: Abnormal   Collection Time    08/22/14  7:26 PM      Result Value Ref Range   Creatinine, Ser 0.72  0.50 - 1.10 mg/dL   GFR calc non Af Amer 72 (*) >90 mL/min   GFR calc Af Amer 84 (*) >90 mL/min   Comment: (NOTE)     The eGFR has been calculated using the CKD EPI equation.     This calculation has not been validated in all clinical situations.     eGFR's persistently <90 mL/min signify possible Chronic Kidney     Disease.  GLUCOSE, CAPILLARY     Status: None   Collection Time    08/22/14 10:54 PM      Result Value Ref Range   Glucose-Capillary 79  70 - 99 mg/dL   Comment 1 Documented in Chart     Comment 2 Notify RN    LIPID PANEL     Status: None   Collection Time    08/23/14  7:18 AM      Result Value Ref  Range   Cholesterol 169  0 - 200 mg/dL   Triglycerides 112  <150 mg/dL   HDL 49  >39 mg/dL   Total CHOL/HDL Ratio 3.4     VLDL 22  0 - 40 mg/dL   LDL Cholesterol 98  0 - 99 mg/dL   Comment:            Total Cholesterol/HDL:CHD Risk     Coronary Heart Disease Risk Table                          Men   Women      1/2 Average Risk   3.4   3.3      Average Risk       5.0   4.4      2 X Average Risk   9.6   7.1      3 X Average Risk  23.4   11.0                Use the calculated Patient Ratio     above and the CHD Risk Table     to determine the patient's CHD Risk.                ATP III CLASSIFICATION (LDL):      <100     mg/dL   Optimal      100-129  mg/dL   Near or Above                        Optimal      130-159  mg/dL   Borderline      160-189  mg/dL   High      >190     mg/dL   Very High  GLUCOSE, CAPILLARY     Status: None   Collection Time    08/23/14  7:34 AM      Result Value Ref Range   Glucose-Capillary 81  70 - 99 mg/dL  GLUCOSE, CAPILLARY     Status: Abnormal   Collection Time    08/23/14 11:35 AM      Result Value Ref Range   Glucose-Capillary 116 (*) 70 - 99 mg/dL    Dg Chest 1 View  08/22/2014   CLINICAL DATA:  80 year old female with altered mental status. Initial encounter.  EXAM: CHEST - 1 VIEW  COMPARISON:  08/30/2010.  FINDINGS: Frontal view the chest at 1310 hrs. The patient is rotated to the right similar to the prior study. Chronic cardiomegaly not significantly changed. Chronic tortuosity of the thoracic aorta also appears stable. Other mediastinal contours are within normal limits. Visualized tracheal air column is within normal limits. Regressed bilateral perihilar opacity. No pneumothorax or pulmonary edema. No pleural effusion or definite consolidation. Osteopenia.  IMPRESSION: Chronic cardiomegaly.  *INSERT* Chest   Electronically Signed   By: Lars Pinks M.D.   On: 08/22/2014 13:47   Ct Head Wo Contrast  08/22/2014   CLINICAL DATA:  Altered mental status .  EXAM: CT HEAD WITHOUT CONTRAST  TECHNIQUE: Contiguous axial images were obtained from the base of the skull through the vertex without intravenous contrast.  COMPARISON:  None.  FINDINGS: No intra-axial or extra-axial pathologic fluid or blood collection. No mass. No hydrocephalus. Diffuse  severe cerebral and cerebellar atrophy. Benign basal ganglia calcification. No acute bony abnormality. Mucous retention cyst right maxillary sinus.  IMPRESSION: No acute abnormality. Diffuse severe cerebral and  cerebellar atrophy. White matter changes consistent with chronic ischemic .   Electronically Signed   By: Marcello Moores  Register   On: 08/22/2014 13:48   US Carotid Duplex Bilateral  08/23/2014   CLINICAL DATA:  TIA.  EXAM: BILATERAL CAROTID DUPLEX ULTRASOUND  TECHNIQUE: Pearline Cables scale imaging, color Doppler and duplex ultrasound were performed of bilateral carotid and vertebral arteries in the neck.  COMPARISON:  None.  FINDINGS: Criteria: Quantification of carotid stenosis is based on velocity parameters that correlate the residual internal carotid diameter with NASCET-based stenosis levels, using the diameter of the distal internal carotid lumen as the denominator for stenosis measurement.  The following velocity measurements were obtained:  RIGHT  ICA:  80 cm/sec  CCA:  54 cm/sec  SYSTOLIC ICA/CCA RATIO:  1.5  DIASTOLIC ICA/CCA RATIO:  1.2  ECA:  85 cm/sec  LEFT  ICA:  58 cm/sec  CCA:  68 cm/sec  SYSTOLIC ICA/CCA RATIO:  0.9  DIASTOLIC ICA/CCA RATIO:  0.7  ECA:  78 cm/sec  RIGHT CAROTID ARTERY: Scattered echogenic plaque throughout the right carotid arteries. No significant stenosis based on the waveforms and velocities.  RIGHT VERTEBRAL ARTERY: Antegrade flow and normal waveform in the right vertebral artery.  LEFT CAROTID ARTERY: Extensive echogenic plaque in the proximal and mid left common carotid artery. Difficult to exclude an ulceration in the left mid common carotid artery due to the irregular plaque in this area. Left carotid arteries are patent without significant stenosis.  LEFT VERTEBRAL ARTERY: Antegrade flow and normal waveform in the left vertebral artery.  IMPRESSION: Moderate atherosclerotic disease in the carotid arteries bilaterally. Estimated degree of stenosis in the internal carotid  arteries is less than 50% bilaterally. Difficult to exclude an ulceration in the left common carotid artery.  Patent vertebral arteries.   Electronically Signed   By: Markus Daft M.D.   On: 08/23/2014 16:45        Dontarius Sheley A. Merlene Hodges, M.D.  Diplomate, Tax adviser of Psychiatry and Neurology ( Neurology). 08/23/2014, 6:42 PM

## 2014-08-23 NOTE — Progress Notes (Signed)
SLP Cancellation Note  Patient Details Name: Amber Hodges MRN: [id number] DOB: 04/07/1919   Cancelled treatment:       Reason Eval/Treat Not Completed: Patient at procedure or test/unavailable. SLP called and pt off floor. Pt passed RN swallow screen, will see tomorrow around Noon.  Thank you,  Havery Moros, CCC-SLP [phone]  PORTER,DABNEY 08/23/2014, 2:55 PM

## 2014-08-23 NOTE — Progress Notes (Signed)
*  PRELIMINARY RESULTS* Echocardiogram 2D Echocardiogram has been performed.  Jeryl Columbia 08/23/2014, 2:44 PM

## 2014-08-23 NOTE — Care Management Note (Addendum)
    Page 1 of 1   08/24/2014     2:58:57 PM CARE MANAGEMENT NOTE 08/24/2014  Patient:  Amber Hodges, Amber Hodges   Account Number:  [id number]  Date Initiated:  08/23/2014  Documentation initiated by:  Kathyrn Sheriff  Subjective/Objective Assessment:   Pt is from home with 24 hour supervision from private duty aids and family. Pt uses a walker at home.     Action/Plan:   Pt plans to discharge home with [facility] PT/OT and speech therapy. Pt's daughter requests [facility] and Kara Mead with [facility] was notified of referral. Will continue to monitor for CM needs prior to discharge.   Anticipated DC Date:  08/24/2014   Anticipated DC Plan:  HOME W HOME HEALTH SERVICES      DC Planning Services  CM consult      Choice offered to / List presented to:          [facility] arranged  HH-2 PT  HH-3 OT  HH-5 SPEECH THERAPY  HH-1 RN      [facility] agency  [facility]   Status of service:  Completed, signed off Medicare Important Message given?  YES (If response is "NO", the following Medicare IM given date fields will be blank) Date Medicare IM given:  08/24/2014 Medicare IM given by:  Kathyrn Sheriff Date Additional Medicare IM given:   Additional Medicare IM given by:    Discharge Disposition:  HOME W HOME HEALTH SERVICES  Per UR Regulation:    If discussed at Long Length of Stay Meetings, dates discussed:    Comments:  08/24/2014 1500 Kathyrn Sheriff, RN, MSN, PCCN Pt plans to discharge home with self care today. Pt will have [facility], per pt's request, for [facility] RN, PT, OT and SLP. Alroy Bailiff of [facility] will obtain pt's information from chart and will contact pt and family. [facility] will have 48 hours to see pt. Pt has no further CM needs at this time.  08/23/2014 1430 Kathyrn Sheriff, RN, MSN, [facility]

## 2014-08-23 NOTE — Progress Notes (Signed)
Nutrition Brief Note  Patient identified on the Malnutrition Screening Tool (MST) Report  Wt Readings from Last 15 Encounters:  08/22/14 96 lb 12.5 oz (43.9 kg)   Amber Hodges is a 80 y.o. female history of hypertension, possible atrial fibrillation, anxiety who was in her usual state of health when her family noted that this morning she was increasingly confused and was speaking gibberish. She was last seen normal yesterday. Her daughter had spoken to her on the phone this morning for a short period of time and felt that she was normal. Within a few hours, she spoke to another daughter who felt she was increasingly confused. She was brought by her son to the emergency room. It was felt that she had a receptive aphasia. She was seen by tele neurology, who felt that patient may have had a TIA. He recommended admission for further workup. Urinalysis does not show any infections. She does not have any fever or dysuria. She's had no vomiting, diarrhea, chest pain, shortness of breath, cough or any other symptoms.  Pt admitted for probable TIA.  Attempted to see pt multiple times, however, pt has been out of the room for numerous tests throughout the day. Intake has been good during hospitalization; PO: 50-75%. Pt passed RN swallow screen. Pt is awaiting SLP swallow evaluation, as requested by family.  Previous records reveal wt of 111# (50.576 kg) on 09/03/2010, however, unable to determine recently wt loss at this time, due to lack of available data. Noted wt fluctuations may be due to home diuretic use.  PT is recommending HHPT.  Labs reviewed- [facility]. Anticipated d/c home tomorrow.   Body mass index is 18.9 kg/(m^2). Patient meets criteria for normal weight range based on current BMI.   Current diet order is Heart Healthy, patient is consuming approximately 50-75% of meals at this time. Labs and medications reviewed.   No nutrition interventions warranted at this time. If nutrition issues arise,  please consult RD.   Amber Hodges, RD, LDN Pager: [phone]

## 2014-08-23 NOTE — Evaluation (Signed)
Physical Therapy Evaluation Patient Details Name: Amber Hodges MRN: [id number] DOB: [birth year] Today's Date: 08/23/2014   History of Present Illness  Amber Hodges is a 80 y.o. female history of hypertension, possible atrial fibrillation, anxiety who was in her usual state of health when her family noted that this morning she was increasingly confused and was speaking gibberish. She was last seen normal yesterday. Her daughter had spoken to her on the phone this morning for a short period of time and felt that she was normal. Within a few hours, she spoke to another daughter who felt she was increasingly confused. She was brought by her son to the emergency room. It was felt that she had a receptive aphasia. She was seen by tele neurology, who felt that patient may have had a TIA. He recommended admission for further workup. Urinalysis does not show any infections. She does not have any fever or dysuria. She's had no vomiting, diarrhea, chest pain, shortness of breath, cough or any other symptoms.    Clinical Impression  Pt is a 80 year old female who presents to physical therapy with dx of altered mental status.  Pt continued to have receptive aphasia during PT evaluation, though family reports this is improving.  Daughter present during evaluation.  During evaluation, pt required mod assist for bed mobility skills, min guard for transfers, and min guard and use of RW for gait of 10 feet. Noted increased Rt knee hyperextension, forward flexed trunk posturing, and decreased step length on the Lt; gait abnormalities attributed to hx of previous stroke with residual Rt sided LE weakness.  Family reports pt has adapted bed mobility skills by crawling into bed (vs. Sitting and swinging legs up) secondary to Rt LE weakness, though due to decreased width of hospital bed pt unable to complete bed mobility skills without assist/normal way she completes bed mobility skills.  Family reports pt will have  caregiver once she is discharged from the hospital, and will look into getting some overnight caregivers for a couple weeks until activity tolerance improves.  Recommend continued PT in the hospital to address strengthening, balance, activity tolerance for improved functional mobility skills with transition to HHPT at discharge.  No DME recommendations.  Pt would also benefit from OT and ST services at home to address generalized weakness and cognition skills.      Follow Up Recommendations Home health PT;Supervision for mobility/OOB (HHPT and HHOT)    Equipment Recommendations  None recommended by PT    Recommendations for Other Services OT consult;Speech consult (Cognition due to receptive aphasia)     Precautions / Restrictions Precautions Precautions: Fall Precaution Comments: Receptive Aphasia.  Hx of Stroke with residual Rt sided LE weakness (drop foot) Restrictions Weight Bearing Restrictions: No      Mobility  Bed Mobility Overal bed mobility: Needs Assistance Bed Mobility: Supine to Sit;Sit to Supine     Supine to sit: Mod assist (for trunk) Sit to supine: Min assist;Mod assist   General bed mobility comments: Sit -> Supine per family, pt normally crawls into bed (to get the Rt leg over the EOB) then rolls over; pt attempted this in hospital though due to small bed width pt had difficulty pulling leg up behind her and required assist to bring Rt > Lt LE into bed.   Transfers Overall transfer level: Needs assistance Equipment used: Rolling walker (2 wheeled) Transfers: Sit to/from Stand Sit to Stand: Min guard  Ambulation/Gait Ambulation/Gait assistance: Min guard Ambulation Distance (Feet): 10 Feet Assistive device: Rolling walker (2 wheeled) Gait Pattern/deviations: Step-to pattern;Decreased step length - left   Gait velocity interpretation: Below normal speed for age/gender General Gait Details: Noted increased Rt knee hyperextension during stance  phase secondary to HS weakness, and step to gait due to foot drop Rt from previous stroke      Balance Overall balance assessment: Needs assistance Sitting-balance support: Feet supported;Single extremity supported Sitting balance-Leahy Scale: Good     Standing balance support: Bilateral upper extremity supported;During functional activity Standing balance-Leahy Scale: Fair                               Pertinent Vitals/Pain Pain Assessment: No/denies pain    Home Living Family/patient expects to be discharged to:: Private residence Living Arrangements: Alone Available Help at Discharge: Family;Personal care attendant (Caregiver 8:30-4:3- M-F, daughter on S/S) Type of Home:  (Duplex) Home Access: Stairs to enter   Entrance Stairs-Number of Steps: 2 Home Layout: One level Home Equipment: Walker - 4 wheels;Grab bars - toilet;Toilet riser      Prior Function Level of Independence: Independent with assistive device(s);Needs assistance   Gait / Transfers Assistance Needed: Family reports pt is typically mod (I) with bed mobility skills, transfers, and household amb with use of rollator.   ADL's / Homemaking Assistance Needed: Caregiver assists with sponge bathing, cooking, and dressing as needed           Extremity/Trunk Assessment               Lower Extremity Assessment: RLE deficits/detail;Generalized weakness RLE Deficits / Details: Rt foot drop with MMT 0/5.  Noted knee hyperextension during gait secondary to HS weakness (from hx of stroke with residual Rt sided weakness).  MMT Rt quad 2+/5.        Communication   Communication: Receptive difficulties  Cognition Arousal/Alertness: Awake/alert Behavior During Therapy: WFL for tasks assessed/performed Overall Cognitive Status: Within Functional Limits for tasks assessed                        Assessment/Plan    PT Assessment Patient needs continued PT services  PT Diagnosis  Abnormality of gait;Generalized weakness   PT Problem List Decreased strength;Decreased activity tolerance;Decreased balance;Decreased mobility  PT Treatment Interventions Balance training;Gait training;Functional mobility training;Therapeutic activities;Therapeutic exercise;Stair training   PT Goals (Current goals can be found in the Care Plan section) Acute Rehab PT Goals Patient Stated Goal: go home PT Goal Formulation: With patient/family Time For Goal Achievement: 09/06/14 Potential to Achieve Goals: Good    Frequency Min 3X/week    End of Session Equipment Utilized During Treatment: Gait belt Activity Tolerance: Patient limited by fatigue Patient left: in bed;with call bell/phone within reach;with bed alarm set           Time: 1242-1310 PT Time Calculation (min): 28 min   Charges:   PT Evaluation $Initial PT Evaluation Tier I: 1 Procedure     Iona Stay 08/23/2014, 1:27 PM

## 2014-08-23 NOTE — Progress Notes (Signed)
[facility] PROGRESS NOTE  Amber Hodges ZOX:[id number] DOB: 07/07/1919 DOA: 08/22/2014 PCP: Cassell Smiles., MD  Assessment/Plan: 1. Altered mental status. Etiology is not entirely clear. Possibly related to TIA. She had CT head which was unremarkable. Unable to tolerate an MRI. Await neurology consultation to discuss further workup options. Await echocardiogram and carotid Doppler. ST beside swallow eval requested per family. She passed RN bedside swallow test.  We'll continue her aspirin. She's not been started on any new medications. Infection appears to be less likely. Await further neurology input. PT evaluation as well.  2. Atrial fibrillation. She is currently on digoxin. Digoxin level is therapeutic. Significant fall risk with right leg weakness. Family reports not on anticoagulation. 3. Bilateral lower extremity edema. Appears at baseline.  Patient is chronically on Lasix.  4. Anxiety. Worsening due to unfamiliar surroundings in setting of baseline dementia i suspect.  Continue her normal clorazepate dosing and monitor. 5. Hypertension. controlled Continue outpatient regimen   Code Status: full  Family Communication: daughters at bedside Disposition Plan: may need placement.    Consultants:  Dr. Gerilyn Pilgrim neurology  Procedures:  none  Antibiotics:  none  HPI/Subjective: Alert. Denies pain/discomfort. Requesting discharge. Reports "i plan to go home today".  Objective: Filed Vitals:   08/23/14 0526  BP: 137/75  Pulse: 74  Temp: 97.8 F (36.6 C)  Resp: 20    Intake/Output Summary (Last 24 hours) at 08/23/14 1021 Last data filed at 08/23/14 0949  Gross per 24 hour  Intake    240 ml  Output      0 ml  Net    240 ml   Filed Weights   08/22/14 1212 08/22/14 1800  Weight: 45.36 kg (100 lb) 43.9 kg (96 lb 12.5 oz)    Exam:   General:  Thin frail, appears slightly anxious  Cardiovascular: irregularly irregular. No MGR. 1+ LE  edema  Respiratory: normal effort BS clear bilaterally no wheeze  Abdomen: non-distended. +BS throughout. Non-tender to palpation  Musculoskeletal: no clubbing or cyanosis   Data Reviewed: Basic Metabolic Panel:  Recent Labs Lab 08/22/14 1238 08/22/14 1243 08/22/14 1926  NA 139 137  --   K 5.2 4.9  --   CL 98 103  --   CO2 29  --   --   GLUCOSE 93 94  --   BUN 17 17  --   CREATININE 0.70 0.80 0.72  CALCIUM 9.5  --   --    Liver Function Tests:  Recent Labs Lab 08/22/14 1238  AST 36  ALT 23  ALKPHOS 132*  BILITOT 0.6  PROT 7.8  ALBUMIN 4.3   No results found for this basename: LIPASE, AMYLASE,  in the last 168 hours No results found for this basename: AMMONIA,  in the last 168 hours CBC:  Recent Labs Lab 08/22/14 1238 08/22/14 1243 08/22/14 1926  WBC 4.1  --  4.3  NEUTROABS 2.0  --   --   HGB 13.9 15.6* 13.0  HCT 41.2 46.0 38.0  MCV 90.9  --  90.5  PLT 124*  --  115*   Cardiac Enzymes: No results found for this basename: CKTOTAL, CKMB, CKMBINDEX, TROPONINI,  in the last 168 hours BNP (last 3 results) No results found for this basename: PROBNP,  in the last 8760 hours CBG:  Recent Labs Lab 08/22/14 1250 08/22/14 2254 08/23/14 0734  GLUCAP 98 79 81    No results found for this or any previous visit (from the past 240  hour(s)).   Studies: Dg Chest 1 View  08/22/2014   CLINICAL DATA:  80 year old female with altered mental status. Initial encounter.  EXAM: CHEST - 1 VIEW  COMPARISON:  08/30/2010.  FINDINGS: Frontal view the chest at 1310 hrs. The patient is rotated to the right similar to the prior study. Chronic cardiomegaly not significantly changed. Chronic tortuosity of the thoracic aorta also appears stable. Other mediastinal contours are within normal limits. Visualized tracheal air column is within normal limits. Regressed bilateral perihilar opacity. No pneumothorax or pulmonary edema. No pleural effusion or definite consolidation. Osteopenia.   IMPRESSION: Chronic cardiomegaly.  *INSERT* Chest   Electronically Signed   By: Augusto Gamble M.D.   On: 08/22/2014 13:47   Ct Head Wo Contrast  08/22/2014   CLINICAL DATA:  Altered mental status .  EXAM: CT HEAD WITHOUT CONTRAST  TECHNIQUE: Contiguous axial images were obtained from the base of the skull through the vertex without intravenous contrast.  COMPARISON:  None.  FINDINGS: No intra-axial or extra-axial pathologic fluid or blood collection. No mass. No hydrocephalus. Diffuse severe cerebral and cerebellar atrophy. Benign basal ganglia calcification. No acute bony abnormality. Mucous retention cyst right maxillary sinus.  IMPRESSION: No acute abnormality. Diffuse severe cerebral and cerebellar atrophy. White matter changes consistent with chronic ischemic .   Electronically Signed   By: Maisie Fus  Register   On: 08/22/2014 13:48    Scheduled Meds: .  stroke: mapping our early stages of recovery book   Does not apply Once  . aspirin  325 mg Oral Daily  . digoxin  0.25 mg Oral Daily  . enoxaparin (LOVENOX) injection  30 mg Subcutaneous Q24H  . furosemide  40 mg Oral BID  . isradipine  10 mg Oral Daily  . ramipril  10 mg Oral Daily  . sodium chloride  3 mL Intravenous Q12H   Continuous Infusions:   Principal Problem:   Altered mental status Active Problems:   Receptive aphasia   Essential hypertension, benign   Atrial fibrillation   Bilateral lower extremity edema   TIA (transient ischemic attack)   Hypertension    Time spent: 35 minutes    [facility] M  [facility] Pager [phone]. If 7PM-7AM, please contact night-coverage at [email or web address], password [facility] 08/23/2014, 10:21 AM  LOS: 1 day

## 2014-08-23 NOTE — Progress Notes (Signed)
Patient seen and examined. Note reviewed  Her dysphagia appears to be mildly improved today. She will undergo further workup including carotid Dopplers and echocardiogram. Neurology has been consulted. Her family feels that she is too claustrophobic to undergo MRI brain. We'll defer further imaging options to neurology.  MEMON,JEHANZEB

## 2014-08-24 ENCOUNTER — Inpatient Hospital Stay (HOSPITAL_COMMUNITY): Payer: Medicare Other

## 2014-08-24 DIAGNOSIS — I6350 Cerebral infarction due to unspecified occlusion or stenosis of unspecified cerebral artery: Principal | ICD-10-CM

## 2014-08-24 DIAGNOSIS — I6389 Other cerebral infarction: Secondary | ICD-10-CM | POA: Diagnosis present

## 2014-08-24 DIAGNOSIS — I6340 Cerebral infarction due to embolism of unspecified cerebral artery: Secondary | ICD-10-CM | POA: Diagnosis present

## 2014-08-24 LAB — GLUCOSE, CAPILLARY
Glucose-Capillary: 101 mg/dL — ABNORMAL HIGH (ref 70–99)
Glucose-Capillary: 126 mg/dL — ABNORMAL HIGH (ref 70–99)

## 2014-08-24 MED ORDER — APIXABAN 2.5 MG PO TABS: 2.5000 mg | ORAL_TABLET | Freq: Two times a day (BID) | ORAL | Status: DC

## 2014-08-24 MED ORDER — ASPIRIN 325 MG PO TABS: 325.0000 mg | ORAL_TABLET | Freq: Every day | ORAL | Status: DC

## 2014-08-24 MED ORDER — FUROSEMIDE 40 MG PO TABS: 40.0000 mg | ORAL_TABLET | Freq: Every day | ORAL | Status: AC

## 2014-08-24 MED ORDER — ASPIRIN 325 MG PO TABS: 325.0000 mg | ORAL_TABLET | Freq: Every day | ORAL | Status: AC

## 2014-08-24 NOTE — Discharge Instructions (Signed)
Stop aspirin on 09/07/14 when starting eliquis.

## 2014-08-24 NOTE — Progress Notes (Signed)
Physical Therapy Treatment Patient Details Name: Amber Hodges MRN: [id number] DOB: 09-16-1919 Today's Date: 08/24/2014    History of Present Illness Amber Hodges is a 80 y.o. female history of hypertension, possible atrial fibrillation, anxiety who was in her usual state of health when her family noted that this morning she was increasingly confused and was speaking gibberish. She was last seen normal yesterday. Her daughter had spoken to her on the phone this morning for a short period of time and felt that she was normal. Within a few hours, she spoke to another daughter who felt she was increasingly confused. She was brought by her son to the emergency room. It was felt that she had a receptive aphasia. She was seen by tele neurology, who felt that patient may have had a TIA. He recommended admission for further workup. Urinalysis does not show any infections. She does not have any fever or dysuria. She's had no vomiting, diarrhea, chest pain, shortness of breath, cough or any other symptoms.      PT Comments    Family brought personal rollator to hospital for PT treatment.  RN made PT aware that pt is requesting OOB activity with ambulation this morning.  Pt was able to perform supine bed exercises, requesting decreased assistance on Rt LE and pt able to complete exercises (I).  Noted mild improvement in bed mobility skills today as pt was able to assist by pushing through UE to bring trunk up into correct sitting posture; pt did demonstrate improved ability to return to bed without physical assist from PT.  Pt demonstrated increased ambulation distance today with min guard assist and use of personal rollator.  Recommend continued PT while in the hospital to address rehab goals, with transition to HHPT at discharge.  Spoke with ST in regards to cognition/speech as pt continues to have episodes of receptive aphasia; ST reports she will evaluate pt today as able.    Follow Up  Recommendations  Home health PT;Supervision for mobility/OOB     Equipment Recommendations  None recommended by PT    Recommendations for Other Services OT consult;Speech consult     Precautions / Restrictions Restrictions Weight Bearing Restrictions: No    Mobility  Bed Mobility Overal bed mobility: Needs Assistance Bed Mobility: Supine to Sit;Sit to Supine     Supine to sit: Mod assist (for trunk) Sit to supine: Min guard;Min assist      Transfers Overall transfer level: Needs assistance Equipment used: Rolling walker (2 wheeled) Transfers: Sit to/from Stand Sit to Stand: Min guard            Ambulation/Gait Ambulation/Gait assistance: Min guard Ambulation Distance (Feet): 20 Feet Assistive device: Rolling walker (2 wheeled) Gait Pattern/deviations: Step-to pattern   Gait velocity interpretation: Below normal speed for age/gender              Exercises General Exercises - Lower Extremity Ankle Circles/Pumps: 10 reps;AROM;Both;Supine Long Arc Quad: 10 reps;AROM;Both;Seated Heel Slides: 20 reps;AROM;Both;Supine Toe Raises: 10 reps;AROM;Both;Seated Heel Raises: 10 reps;AROM;Both;Seated            PT Goals (current goals can now be found in the care plan section) Progress towards PT goals: Progressing toward goals    Frequency  Min 3X/week    PT Plan Current plan remains appropriate       End of Session Equipment Utilized During Treatment: Gait belt Activity Tolerance: Patient limited by fatigue Patient left: in bed;with call bell/phone within reach;with bed alarm set;with family/visitor  present     Time: 1030-1100 PT Time Calculation (min): 30 min  Charges:  $Gait Training: 8-22 mins $Therapeutic Exercise: 8-22 mins                    Haili Donofrio 08/24/2014, 12:03 PM

## 2014-08-24 NOTE — Progress Notes (Signed)
Stroke booklet given and family educated. All members verbalized understanding.

## 2014-08-24 NOTE — Discharge Summary (Addendum)
Physician Discharge Summary  PARADISE VENSEL ZOX:[id number] DOB: 08/30/1919 DOA: 08/22/2014  PCP: Cassell Smiles., MD  Admit date: 08/22/2014 Discharge date: 08/24/2014  Time spent: 40 minutes  Recommendations for Outpatient Follow-up:  1. Follow up with PCP 1-2 weeks for evaluation of symptoms 2. Home health PT, OT, speech, RN for evaluation/progression of condition, strength and endurance  Discharge Diagnoses:  Principal Problem:   Left temporal lobe infarction Active Problems:   Altered mental status   Receptive aphasia   Essential hypertension, benign   Atrial fibrillation   Bilateral lower extremity edema   TIA (transient ischemic attack)   Hypertension Chronic right leg weakness  Discharge Condition: stable  Diet recommendation: heart healthy  Filed Weights   08/22/14 1212 08/22/14 1800  Weight: 45.36 kg (100 lb) 43.9 kg (96 lb 12.5 oz)    History of present illness:  Amber Hodges is a 80 y.o. female history of hypertension, possible atrial fibrillation, anxiety who was in her usual state of health when her family noted that the morning of 08/22/14 she was increasingly confused and was speaking gibberish. She was last seen normal the day prior. Her daughter had spoken to her on the phone that morning for a short period of time and felt that she was normal. Within a few hours, she spoke to another daughter who felt she was increasingly confused. She was brought by her son to the emergency room. It was felt that she had a receptive aphasia. She was seen by tele neurology, who felt that patient may have had a TIA. He recommended admission for further workup. Urinalysis did not show any infections. She did not have any fever or dysuria. She'd had no vomiting, diarrhea, chest pain, shortness of breath, cough or any other symptoms  Hospital Course:  1. Left temporal lobe infarction per repeat CT head. Echocardiogram with EF 55% unable to evaluate LV diastolic dysfunction  due to afib. Carotid Doppler as below. ST beside swallow eval recommended. PT evaluation recommends HH PT OT speech. Evaluated by Dr Gerilyn Pilgrim who recommends starting eliquis in 2 weeks at which time aspirin can be stopped. Follow up PCP in 1-2 weeks  2. Atrial fibrillation. She is currently on digoxin. Digoxin level is therapeutic. Significant fall risk with right leg weakness. See #1.  3. Bilateral lower extremity edema. Remained at baseline. Patient is chronically on Lasix.  4. Anxiety. Worsening due to unfamiliar surroundings in setting of baseline dementia i suspect. Continue her normal clorazepate dosing and monitor. 5. Hypertension. controlled Continue outpatient regimen  Procedures: . Wall thickness was increased in a pattern of moderate LVH. Systolic function was normal. The estimated ejection fraction was in the range of 55% to 60%. The study was not technically sufficient to allow evaluation of LV diastolic dysfunction due to atrial fibrillation.   Consultations:  Dr Gerilyn Pilgrim neurology  Speech therapy  Discharge Exam: Filed Vitals:   08/24/14 0900  BP: 127/72  Pulse: 85  Temp: 98.3 F (36.8 C)  Resp: 22    General: well nourished NAD Cardiovascular: irregularly irregular no m/g/r 1+LE edema Respiratory: normal effort BS clear bilaterally no wheeze  Discharge Instructions You were cared for by a hospitalist during your hospital stay. If you have any questions about your discharge medications or the care you received while you were in the hospital after you are discharged, you can call the unit and asked to speak with the hospitalist on call if the hospitalist that took care of you is not available.  Once you are discharged, your primary care physician will handle any further medical issues. Please note that NO REFILLS for any discharge medications will be authorized once you are discharged, as it is imperative that you return to your primary care physician (or establish a  relationship with a primary care physician if you do not have one) for your aftercare needs so that they can reassess your need for medications and monitor your lab values.     Medication List         apixaban 2.5 MG Tabs tablet  Commonly known as:  ELIQUIS  Take 1 tablet (2.5 mg total) by mouth 2 (two) times daily.  Start taking on:  09/07/2014     aspirin 325 MG tablet  Take 1 tablet (325 mg total) by mouth daily.     clorazepate 15 MG tablet  Commonly known as:  TRANXENE  Take 15 mg by mouth 2 (two) times daily.     digoxin 0.25 MG tablet  Commonly known as:  LANOXIN  Take 0.25 mg by mouth daily.     isradipine 5 MG capsule  Commonly known as:  DYNACIRC  Take 10 mg by mouth daily.     potassium chloride 10 MEQ tablet  Commonly known as:  K-DUR  Take 10 mEq by mouth 2 (two) times daily.     ramipril 10 MG capsule  Commonly known as:  ALTACE  Take 10 mg by mouth daily.       No Known Allergies    The results of significant diagnostics from this hospitalization (including imaging, microbiology, ancillary and laboratory) are listed below for reference.    Significant Diagnostic Studies: Dg Chest 1 View  08/22/2014   CLINICAL DATA:  80 year old female with altered mental status. Initial encounter.  EXAM: CHEST - 1 VIEW  COMPARISON:  08/30/2010.  FINDINGS: Frontal view the chest at 1310 hrs. The patient is rotated to the right similar to the prior study. Chronic cardiomegaly not significantly changed. Chronic tortuosity of the thoracic aorta also appears stable. Other mediastinal contours are within normal limits. Visualized tracheal air column is within normal limits. Regressed bilateral perihilar opacity. No pneumothorax or pulmonary edema. No pleural effusion or definite consolidation. Osteopenia.  IMPRESSION: Chronic cardiomegaly.  *INSERT* Chest   Electronically Signed   By: Augusto Gamble M.D.   On: 08/22/2014 13:47   Ct Head Wo Contrast  08/24/2014   CLINICAL DATA:   Altered mental status, a aphasia  EXAM: CT HEAD WITHOUT CONTRAST  TECHNIQUE: Contiguous axial images were obtained from the base of the skull through the vertex without intravenous contrast.  COMPARISON:  Prior CT from 08/22/2014  FINDINGS: Study is somewhat degraded by motion artifact.  Global atrophy with chronic microvascular ischemic changes again noted.  There is a area of parenchymal hypodensity with blurring of the gray-white matter differentiation within the posterior left temporal lobe, not definitely seen on prior exam, and concerning for possible acute ischemia (series 2, image 16). No intracranial hemorrhage. No significant mass effect at this time. No midline shift or hydrocephalus. No mass lesion.  The calvarium is intact.  Orbital soft tissues are within normal limits.  Probable retention cyst noted within the right maxillary sinus. Paranasal sinuses are otherwise clear. No mastoid effusion.  Scalp soft tissues are unremarkable.  IMPRESSION: 1. Ill defined hypodensity within the posterior left temporal lobe, concerning for possible acute ischemia. Further evaluation with brain MRI would likely be helpful for further characterization. No intracranial hemorrhage. 2.  Stable atrophy with chronic microvascular ischemic changes.  Critical Value/emergent results were called by telephone at the time of interpretation on 08/24/2014 at 1:58 am to the nurse taking care the patient, Ellery Plunk , who verbally acknowledged these results.   Electronically Signed   By: Rise Mu M.D.   On: 08/24/2014 02:02   Ct Head Wo Contrast  08/22/2014   CLINICAL DATA:  Altered mental status .  EXAM: CT HEAD WITHOUT CONTRAST  TECHNIQUE: Contiguous axial images were obtained from the base of the skull through the vertex without intravenous contrast.  COMPARISON:  None.  FINDINGS: No intra-axial or extra-axial pathologic fluid or blood collection. No mass. No hydrocephalus. Diffuse severe cerebral and cerebellar  atrophy. Benign basal ganglia calcification. No acute bony abnormality. Mucous retention cyst right maxillary sinus.  IMPRESSION: No acute abnormality. Diffuse severe cerebral and cerebellar atrophy. White matter changes consistent with chronic ischemic .   Electronically Signed   By: Maisie Fus  Register   On: 08/22/2014 13:48   US Carotid Duplex Bilateral  08/23/2014   CLINICAL DATA:  TIA.  EXAM: BILATERAL CAROTID DUPLEX ULTRASOUND  TECHNIQUE: Wallace Cullens scale imaging, color Doppler and duplex ultrasound were performed of bilateral carotid and vertebral arteries in the neck.  COMPARISON:  None.  FINDINGS: Criteria: Quantification of carotid stenosis is based on velocity parameters that correlate the residual internal carotid diameter with NASCET-based stenosis levels, using the diameter of the distal internal carotid lumen as the denominator for stenosis measurement.  The following velocity measurements were obtained:  RIGHT  ICA:  80 cm/sec  CCA:  54 cm/sec  SYSTOLIC ICA/CCA RATIO:  1.5  DIASTOLIC ICA/CCA RATIO:  1.2  ECA:  85 cm/sec  LEFT  ICA:  58 cm/sec  CCA:  68 cm/sec  SYSTOLIC ICA/CCA RATIO:  0.9  DIASTOLIC ICA/CCA RATIO:  0.7  ECA:  78 cm/sec  RIGHT CAROTID ARTERY: Scattered echogenic plaque throughout the right carotid arteries. No significant stenosis based on the waveforms and velocities.  RIGHT VERTEBRAL ARTERY: Antegrade flow and normal waveform in the right vertebral artery.  LEFT CAROTID ARTERY: Extensive echogenic plaque in the proximal and mid left common carotid artery. Difficult to exclude an ulceration in the left mid common carotid artery due to the irregular plaque in this area. Left carotid arteries are patent without significant stenosis.  LEFT VERTEBRAL ARTERY: Antegrade flow and normal waveform in the left vertebral artery.  IMPRESSION: Moderate atherosclerotic disease in the carotid arteries bilaterally. Estimated degree of stenosis in the internal carotid arteries is less than 50% bilaterally.  Difficult to exclude an ulceration in the left common carotid artery.  Patent vertebral arteries.   Electronically Signed   By: Richarda Overlie M.D.   On: 08/23/2014 16:45    Microbiology: No results found for this or any previous visit (from the past 240 hour(s)).   Labs: Basic Metabolic Panel:  Recent Labs Lab 08/22/14 1238 08/22/14 1243 08/22/14 1926  NA 139 137  --   K 5.2 4.9  --   CL 98 103  --   CO2 29  --   --   GLUCOSE 93 94  --   BUN 17 17  --   CREATININE 0.70 0.80 0.72  CALCIUM 9.5  --   --    Liver Function Tests:  Recent Labs Lab 08/22/14 1238  AST 36  ALT 23  ALKPHOS 132*  BILITOT 0.6  PROT 7.8  ALBUMIN 4.3   No results found for this basename: LIPASE, AMYLASE,  in the last 168 hours No results found for this basename: AMMONIA,  in the last 168 hours CBC:  Recent Labs Lab 08/22/14 1238 08/22/14 1243 08/22/14 1926  WBC 4.1  --  4.3  NEUTROABS 2.0  --   --   HGB 13.9 15.6* 13.0  HCT 41.2 46.0 38.0  MCV 90.9  --  90.5  PLT 124*  --  115*   Cardiac Enzymes: No results found for this basename: CKTOTAL, CKMB, CKMBINDEX, TROPONINI,  in the last 168 hours BNP: BNP (last 3 results) No results found for this basename: PROBNP,  in the last 8760 hours CBG:  Recent Labs Lab 08/23/14 1135 08/23/14 1639 08/23/14 2019 08/24/14 0753 08/24/14 1112  GLUCAP 116* 99 83 101* 126*       Signed:  BLACK,KAREN M  [facility] 08/24/2014, 1:57 PM

## 2014-08-24 NOTE — Progress Notes (Signed)
[facility] of [facility] Radiology called with CT results. Pt has left temporal lobe ischemia with no hemorrhage or mass. MD paged.

## 2014-08-24 NOTE — Evaluation (Signed)
Speech Language Pathology Evaluation Patient Details Name: Amber Hodges MRN: [id number] DOB: 16-Apr-1919 Today's Date: 08/24/2014 Time: 4098-1191 SLP Time Calculation (min): 29 min  Problem List:  Patient Active Problem List   Diagnosis Date Noted  . 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 TIA (transient ischemic attack) 08/23/2014  . Hypertension   . Altered mental status 08/22/2014  . Receptive aphasia 08/22/2014  . Essential hypertension, benign 08/22/2014  . Atrial fibrillation 08/22/2014  . Bilateral lower extremity edema 08/22/2014   Past Medical History:  Past Medical History  Diagnosis Date  . Angina pectoris   . Hypertension   . Peripheral edema    Past Surgical History: History reviewed. No pertinent past surgical history. HPI:  Amber Hodges is a 80 year old white female who presents with the acute onset of confusion, altered mental status and difficulty speaking. The patient children check in on her throughout the day. She does live by herself but has a sitter who stays from a dental for during the weekdays. On Sunday she was noted to be normal at 9 and 10 AM. However, at 11 when another daughter called the patient. She is noted to be confused and having difficulty speaking. The patient was taken to the emergency room for further evaluation by another son. It appears she was not given TPA because of timing issues and being outside of the therapeutic window. The patient has improved by the daughter who is at the bedside today stating that she clearly has significant difficulties communicating and is not baseline. The patient does have a history ofAtrial fibrillation. She has been on aspirin but not on anticoagulation because of concerns of the patient's gait. 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 She has done fairly well with this but did have some difficulties getting around  due to swelling of her legs. The daughter tells me that she has not had any falls recently since she has been on Lasix and uses a walker to get around. She has an aide which helps her daytime. Last fall may have been about a year ago. The patient's husband was on warfarin and the patient swears that she would never be on this medication because of all the monitoring associated with it. The daughter also reports that the patient seemed to have significant issues with agoraphobia. CT Head shows: Ill defined hypodensity within the posterior left temporal lobe, concerning for possible acute ischemia. Further evaluation with brain MRI would likely be helpful for further characterization. No intracranial hemorrhage. Stable atrophy with chronic microvascular ischemic changes. Pt passed RN swallow screen and daughter confirmed that pt is not having issues with dysphagia at this time, but was eager to speak with SLP in regards to speech/language deficits.   Assessment / Plan / Recommendation Clinical Impression  Amber Hodges presents with mild receptive and moderate expressive aphasia characterized by decreased comprehension of mod/complex yes/no questions, difficulty following 2-step commands, anomia, paraphasic errors, telegraphic speech at times, and perseveration of previously stated material. She had difficulty switching automatic speech tasks (counting, days of week, months) and required mod cues to get started. Pt is not fully aware of errors and does not yet appear frustrated when trying to communicate. Pt's daughters were in room for the evaluation and strategies for successful communication were reviewed and questions addressed. Her daughter reports that she thinks her Mom's comprehension is unaffected, however deficits were noted during the evaluation.  Pt will be going home with home health therapy due to agoraphobia. Continued speech therapy is strongly recommended to address current communication deficits.     SLP Assessment  All further Speech Lanaguage Pathology  needs can be addressed in the next venue of care (Pt will likely go home today with Home Health PT/OT/ST)    Follow Up Recommendations  Home health SLP    Frequency and Duration  N/A     Pertinent Vitals/Pain Pain Assessment: No/denies pain       SLP Evaluation Prior Functioning  Cognitive/Linguistic Baseline: Within functional limits Type of Home: Other(Comment) (Duplex) Available Help at Discharge: Family;Personal care attendant (Caregiver 8:30-4:3- M-F, daughter on S/S) Vocation: Retired   IT consultant  Overall Cognitive Status: Impaired/Different from baseline Arousal/Alertness: Awake/alert Orientation Level: Oriented to person;Oriented to place;Oriented to situation;Disoriented to time Memory: Impaired Memory Impairment: Retrieval deficit Awareness: Impaired Awareness Impairment: Emergent impairment Problem Solving: Impaired Problem Solving Impairment: Verbal complex;Functional complex Executive Function: Decision Making;Self Correcting Decision Making: Impaired Decision Making Impairment: Verbal complex;Functional complex (frequently deferred to her daughter for answers) Self Correcting: Impaired Self Correcting Impairment: Verbal basic Safety/Judgment: Appears intact    Comprehension  Auditory Comprehension Overall Auditory Comprehension: Impaired Yes/No Questions: Impaired Basic Biographical Questions: 76-100% accurate Basic Immediate Environment Questions: 75-100% accurate Complex Questions: 50-74% accurate Commands: Impaired One Step Basic Commands: 75-100% accurate Two Step Basic Commands: 50-74% accurate Conversation: Simple Interfering Components: Processing speed EffectiveTechniques: Extra processing time;Repetition Visual Recognition/Discrimination Discrimination: Not tested Reading Comprehension Reading Status: Not tested (Pt reportedly reads Bible/devotional stories)    Expression  Expression Primary Mode of Expression: Verbal Verbal Expression Overall Verbal Expression: Impaired Initiation: No impairment Automatic Speech: Counting;Day of week;Month of year (impaired, perseveration, and paraphasic errors) Level of Generative/Spontaneous Verbalization: Sentence Repetition: Impaired Level of Impairment: Phrase level Naming: Impairment Responsive: 51-75% accurate Confrontation: Impaired Convergent: Not tested Divergent: 0-24% accurate (2 animals in 60 seconds, 2 vegetables in 60 seconds) Verbal Errors: Semantic paraphasias;Phonemic paraphasias;Neologisms;Perseveration;Not aware of errors Pragmatics: No impairment Effective Techniques: Sentence completion Non-Verbal Means of Communication: Not applicable Written Expression Written Expression: Not tested   Oral / Motor Oral Motor/Sensory Function Overall Oral Motor/Sensory Function: Appears within functional limits for tasks assessed Labial ROM: Within Functional Limits Labial Symmetry: Within Functional Limits Labial Strength: Within Functional Limits Labial Sensation: Within Functional Limits Lingual ROM: Within Functional Limits Lingual Symmetry: Within Functional Limits Lingual Strength: Within Functional Limits Lingual Sensation: Within Functional Limits Facial ROM: Within Functional Limits Facial Symmetry: Within Functional Limits Facial Strength: Within Functional Limits Facial Sensation: Within Functional Limits Velum: Within Functional Limits Mandible: Within Functional Limits Motor Speech Overall Motor Speech: Appears within functional limits for tasks assessed Respiration: Within functional limits Phonation: Normal Resonance: Within functional limits Articulation: Within functional limitis Intelligibility: Intelligible Motor Planning: Witnin functional limits Motor Speech Errors: Not applicable   Thank you,  Havery Moros, CCC-SLP [phone]      Dashayla Theissen 08/24/2014, 2:03 PM

## 2014-08-25 NOTE — Discharge Summary (Signed)
Patient seen and examined.  Agree with note as above.  She has been admitted with aphasia and was found to have an acute stroke.  She was seen by neurology and with her history of atrial fib, recommendations were to start anticoagulation with eliquis, but to wait 2 weeks to prevent possibility of hemorrhagic conversion.  She will be continued on aspirin for now.  LDL was checked and found to be less than 100. She has been set up with home physical therapy and speech therapy.  She is ready for discharge home.  [name]

## 2014-12-15 ENCOUNTER — Encounter (HOSPITAL_COMMUNITY): Payer: Self-pay | Admitting: *Deleted

## 2014-12-15 ENCOUNTER — Emergency Department (HOSPITAL_COMMUNITY): Payer: Medicare Other

## 2014-12-15 ENCOUNTER — Inpatient Hospital Stay (HOSPITAL_COMMUNITY): Payer: Medicare Other

## 2014-12-15 ENCOUNTER — Inpatient Hospital Stay (HOSPITAL_COMMUNITY)
Admission: EM | Admit: 2014-12-15 | Discharge: 2014-12-20 | DRG: 062 | Disposition: A | Discharge status: Home Health | Payer: Medicare Other | Attending: Neurology | Admitting: Neurology

## 2014-12-15 DIAGNOSIS — I639 Cerebral infarction, unspecified: Secondary | ICD-10-CM

## 2014-12-15 DIAGNOSIS — I6389 Other cerebral infarction: Secondary | ICD-10-CM | POA: Diagnosis present

## 2014-12-15 DIAGNOSIS — R531 Weakness: Secondary | ICD-10-CM

## 2014-12-15 DIAGNOSIS — D571 Sickle-cell disease without crisis: Secondary | ICD-10-CM | POA: Diagnosis present

## 2014-12-15 DIAGNOSIS — Z66 Do not resuscitate: Secondary | ICD-10-CM | POA: Diagnosis present

## 2014-12-15 DIAGNOSIS — I63411 Cerebral infarction due to embolism of right middle cerebral artery: Principal | ICD-10-CM | POA: Diagnosis present

## 2014-12-15 DIAGNOSIS — I359 Nonrheumatic aortic valve disorder, unspecified: Secondary | ICD-10-CM

## 2014-12-15 DIAGNOSIS — I63421 Cerebral infarction due to embolism of right anterior cerebral artery: Secondary | ICD-10-CM | POA: Diagnosis present

## 2014-12-15 DIAGNOSIS — H9190 Unspecified hearing loss, unspecified ear: Secondary | ICD-10-CM | POA: Diagnosis present

## 2014-12-15 DIAGNOSIS — R471 Dysarthria and anarthria: Secondary | ICD-10-CM | POA: Diagnosis present

## 2014-12-15 DIAGNOSIS — R059 Cough, unspecified: Secondary | ICD-10-CM

## 2014-12-15 DIAGNOSIS — I10 Essential (primary) hypertension: Secondary | ICD-10-CM | POA: Diagnosis present

## 2014-12-15 DIAGNOSIS — Z8673 Personal history of transient ischemic attack (TIA), and cerebral infarction without residual deficits: Secondary | ICD-10-CM

## 2014-12-15 DIAGNOSIS — M25571 Pain in right ankle and joints of right foot: Secondary | ICD-10-CM | POA: Diagnosis present

## 2014-12-15 DIAGNOSIS — R05 Cough: Secondary | ICD-10-CM

## 2014-12-15 DIAGNOSIS — I69351 Hemiplegia and hemiparesis following cerebral infarction affecting right dominant side: Secondary | ICD-10-CM | POA: Diagnosis not present

## 2014-12-15 DIAGNOSIS — I4891 Unspecified atrial fibrillation: Secondary | ICD-10-CM | POA: Diagnosis present

## 2014-12-15 DIAGNOSIS — M25579 Pain in unspecified ankle and joints of unspecified foot: Secondary | ICD-10-CM

## 2014-12-15 DIAGNOSIS — E785 Hyperlipidemia, unspecified: Secondary | ICD-10-CM | POA: Diagnosis present

## 2014-12-15 DIAGNOSIS — R748 Abnormal levels of other serum enzymes: Secondary | ICD-10-CM | POA: Diagnosis present

## 2014-12-15 DIAGNOSIS — I6340 Cerebral infarction due to embolism of unspecified cerebral artery: Secondary | ICD-10-CM | POA: Diagnosis present

## 2014-12-15 DIAGNOSIS — M79605 Pain in left leg: Secondary | ICD-10-CM | POA: Diagnosis present

## 2014-12-15 HISTORY — DX: Cerebral infarction, unspecified: I63.9

## 2014-12-15 LAB — CBC
HEMATOCRIT: 41 % (ref 36.0–46.0)
HEMOGLOBIN: 14 g/dL (ref 12.0–15.0)
MCH: 31.2 pg (ref 26.0–34.0)
MCHC: 34.1 g/dL (ref 30.0–36.0)
MCV: 91.3 fL (ref 78.0–100.0)
Platelets: 188 10*3/uL (ref 150–400)
RBC: 4.49 MIL/uL (ref 3.87–5.11)
RDW: 14.9 % (ref 11.5–15.5)
WBC: 6.5 10*3/uL (ref 4.0–10.5)

## 2014-12-15 LAB — COMPREHENSIVE METABOLIC PANEL
ALBUMIN: 3.8 g/dL (ref 3.5–5.2)
ALK PHOS: 259 U/L — AB (ref 39–117)
ALT: 25 U/L (ref 0–35)
AST: 46 U/L — ABNORMAL HIGH (ref 0–37)
Anion gap: 8 (ref 5–15)
BILIRUBIN TOTAL: 0.9 mg/dL (ref 0.3–1.2)
BUN: 18 mg/dL (ref 6–23)
CO2: 30 mmol/L (ref 19–32)
Calcium: 9.1 mg/dL (ref 8.4–10.5)
Chloride: 97 mEq/L (ref 96–112)
Creatinine, Ser: 0.82 mg/dL (ref 0.50–1.10)
GFR calc Af Amer: 69 mL/min — ABNORMAL LOW (ref >90)
GFR, EST NON AFRICAN AMERICAN: 60 mL/min — AB (ref >90)
Glucose, Bld: 97 mg/dL (ref 70–99)
POTASSIUM: 4.4 mmol/L (ref 3.5–5.1)
Sodium: 135 mmol/L (ref 135–145)
Total Protein: 7.3 g/dL (ref 6.0–8.3)

## 2014-12-15 LAB — I-STAT CHEM 8, ED
BUN: 18 mg/dL (ref 6–23)
CHLORIDE: 97 meq/L (ref 96–112)
CREATININE: 1 mg/dL (ref 0.50–1.10)
Calcium, Ion: 1.13 mmol/L (ref 1.13–1.30)
GLUCOSE: 103 mg/dL — AB (ref 70–99)
HCT: 42 % (ref 36.0–46.0)
HEMOGLOBIN: 14.3 g/dL (ref 12.0–15.0)
POTASSIUM: 4.6 mmol/L (ref 3.5–5.1)
Sodium: 134 mmol/L — ABNORMAL LOW (ref 135–145)
TCO2: 26 mmol/L (ref 0–100)

## 2014-12-15 LAB — DIFFERENTIAL
BASOS ABS: 0 10*3/uL (ref 0.0–0.1)
BASOS PCT: 0 % (ref 0–1)
Eosinophils Absolute: 0.1 10*3/uL (ref 0.0–0.7)
Eosinophils Relative: 1 % (ref 0–5)
Lymphocytes Relative: 35 % (ref 12–46)
Lymphs Abs: 2.3 10*3/uL (ref 0.7–4.0)
MONOS PCT: 11 % (ref 3–12)
Monocytes Absolute: 0.7 10*3/uL (ref 0.1–1.0)
NEUTROS PCT: 53 % (ref 43–77)
Neutro Abs: 3.4 10*3/uL (ref 1.7–7.7)

## 2014-12-15 LAB — PROTIME-INR
INR: 1.04 (ref 0.00–1.49)
Prothrombin Time: 13.7 seconds (ref 11.6–15.2)

## 2014-12-15 LAB — MRSA PCR SCREENING: MRSA BY PCR: NEGATIVE

## 2014-12-15 LAB — I-STAT CREATININE, ED: Creatinine, Ser: 0.9 mg/dL (ref 0.50–1.10)

## 2014-12-15 LAB — ETHANOL: Alcohol, Ethyl (B): <5 mg/dL (ref 0–9)

## 2014-12-15 LAB — I-STAT TROPONIN, ED: Troponin i, poc: 0.02 ng/mL (ref 0.00–0.08)

## 2014-12-15 LAB — APTT: APTT: 25 s (ref 24–37)

## 2014-12-15 MED ORDER — PANTOPRAZOLE SODIUM 40 MG IV SOLR
40.0000 mg | Freq: Every day | INTRAVENOUS | Status: DC
  Administered 2014-12-15: 40 mg via INTRAVENOUS
  Filled 2014-12-15: qty 40

## 2014-12-15 MED ORDER — FUROSEMIDE 10 MG/ML IJ SOLN
20.0000 mg | Freq: Every day | INTRAMUSCULAR | Status: DC
  Administered 2014-12-16: 20 mg via INTRAVENOUS
  Filled 2014-12-15: qty 2

## 2014-12-15 MED ORDER — SODIUM CHLORIDE 0.9 % IV SOLN
250.0000 mL | Freq: Once | INTRAVENOUS | Status: DC
Start: 2014-12-15 — End: 2014-12-16

## 2014-12-15 MED ORDER — ALTEPLASE (STROKE) FULL DOSE INFUSION
0.9000 mg/kg | Freq: Once | INTRAVENOUS | Status: AC
  Administered 2014-12-15: 40 mg via INTRAVENOUS
  Filled 2014-12-15: qty 40

## 2014-12-15 MED ORDER — ACETAMINOPHEN 650 MG RE SUPP: 650.0000 mg | RECTAL | Status: DC | PRN

## 2014-12-15 MED ORDER — SODIUM CHLORIDE 0.9 % IV SOLN
INTRAVENOUS | Status: DC
  Administered 2014-12-15: 13:00:00 via INTRAVENOUS

## 2014-12-15 MED ORDER — LABETALOL HCL 5 MG/ML IV SOLN: 10.0000 mg | INTRAVENOUS | Status: DC | PRN

## 2014-12-15 MED ORDER — CHLORHEXIDINE GLUCONATE 0.12 % MT SOLN
15.0000 mL | Freq: Two times a day (BID) | OROMUCOSAL | Status: DC
  Administered 2014-12-15: 15 mL via OROMUCOSAL
  Filled 2014-12-15: qty 15

## 2014-12-15 MED ORDER — DIGOXIN 0.25 MG/ML IJ SOLN
0.2000 mg | Freq: Every day | INTRAMUSCULAR | Status: DC
  Administered 2014-12-16: 0.2 mg via INTRAVENOUS
  Filled 2014-12-15 (×2): qty 1

## 2014-12-15 MED ORDER — STROKE: EARLY STAGES OF RECOVERY BOOK
Freq: Once | Status: AC
  Administered 2014-12-15: 13:00:00
  Filled 2014-12-15: qty 1

## 2014-12-15 MED ORDER — ACETAMINOPHEN 325 MG PO TABS
650.0000 mg | ORAL_TABLET | ORAL | Status: DC | PRN
  Administered 2014-12-16 – 2014-12-19 (×9): 650 mg via ORAL
  Filled 2014-12-15 (×9): qty 2

## 2014-12-15 MED ORDER — CETYLPYRIDINIUM CHLORIDE 0.05 % MT LIQD: 7.0000 mL | Freq: Two times a day (BID) | OROMUCOSAL | Status: DC

## 2014-12-15 NOTE — Progress Notes (Signed)
UR completed.  Tayli Buch, RN BSN MHA CCM Trauma/Neuro ICU Case Manager [phone]  

## 2014-12-15 NOTE — Progress Notes (Signed)
Echocardiogram 2D Echocardiogram has been performed.  Allea Kassner 12/15/2014, 3:25 PM

## 2014-12-15 NOTE — ED Notes (Signed)
Called [facility] 

## 2014-12-15 NOTE — Evaluation (Signed)
Clinical/Bedside Swallow Evaluation Patient Details  Name: Amber AmorMargaret M Job MRN: [id number] Date of Birth: 07/15/1919  Today's Date: 12/15/2014 Time: [id number] SLP Time Calculation (min) (ACUTE ONLY): 12 min  Past Medical History:  Past Medical History  Diagnosis Date  . Angina pectoris   . Hypertension   . Peripheral edema   . Stroke    Past Surgical History: History reviewed. No pertinent past surgical history. HPI:  80 y.o. female with a PMHx of CHF, HTN, CVA with aphasia August 2015 who presents to the Emergency Department here for sudden onset difficulty speaking and right arm weakness onset 8:50 AM 12/23.  Per notes, pt with increased choking on food last three weeks.     Assessment / Plan / Recommendation Clinical Impression  Pt presents with an acute neurogenic dysphagia with right sided focal deficits CN V,VII, X, XII.  Pt with poor oral control of limited PO trials as well as secretions.  Poor pharyngeal management of secretions evident; overt s/s of aspiration are present.  Recommend NPO for now; SLP will return next date to assess PO readiness.  Discussed with family, who agree with plan.      Aspiration Risk  Severe    Diet Recommendation NPO        Other  Recommendations Oral Care Recommendations:  (oral care QD)   Follow Up Recommendations   (tba)    Frequency and Duration min 3x week  2 weeks       SLP Swallow Goals     Swallow Study Prior Functional Status       General Date of Onset: 12/15/14 HPI: 80 y.o. female with a PMHx of CHF, HTN, CVA with aphasia August 2015 who presents to the Emergency Department here for sudden onset difficulty speaking and right arm weakness onset 8:50 AM 12/23.  Per notes, pt with increased choking on food last three weeks.   Type of Study: Bedside swallow evaluation Previous Swallow Assessment: none per records Diet Prior to this Study: NPO Temperature Spikes Noted: No Respiratory Status: Nasal cannula History of  Recent Intubation: No Behavior/Cognition: Alert;Cooperative;Pleasant mood Oral Cavity - Dentition: Missing dentition Self-Feeding Abilities: Needs assist Patient Positioning: Upright in bed Baseline Vocal Quality: Wet Volitional Cough: Weak Volitional Swallow: Able to elicit    Oral/Motor/Sensory Function Overall Oral Motor/Sensory Function: Other (comment) (asymmetry right CN V,VII,XII)   Ice Chips Ice chips: Impaired Presentation: Spoon Oral Phase Impairments: Reduced labial seal Oral Phase Functional Implications: Right anterior spillage;Oral residue Pharyngeal Phase Impairments: Suspected delayed Swallow;Decreased hyoid-laryngeal movement;Wet Vocal Quality;Cough - Immediate   Thin Liquid Thin Liquid: Impaired Presentation: Spoon Oral Phase Impairments: Reduced labial seal;Reduced lingual movement/coordination Oral Phase Functional Implications: Right anterior spillage Pharyngeal  Phase Impairments: Suspected delayed Swallow;Decreased hyoid-laryngeal movement;Multiple swallows;Cough - Immediate    Nectar Thick Nectar Thick Liquid: Not tested   Honey Thick Honey Thick Liquid: Not tested   Puree Puree: Not tested   Solid  Hriday Stai L. [place], [place] CCC/SLP Pager [phone]     Solid: Not tested       Blenda Mountsouture, Valary Manahan Laurice 12/15/2014,3:51 PM

## 2014-12-15 NOTE — ED Notes (Signed)
Called [facility] and Faxed Consult Request Form.

## 2014-12-15 NOTE — Consult Note (Deleted)
Neurology H&P  CC: right sided weakness  History is obtained from:patient  HPI: Amber Hodges is a 80 y.o. female with a history of afib and previous stroke who presents with acute right sided weakness/dysarthria starting earlier today. She was eating breakfast and had a witnessed change at 8:50am. She went to [employer] adn had tpa administered  Also of note, she has not been walking for the past week due to right ankle pain.   I discussed code status with the patient and she states that if something were to happen she would in no circumstance want a breathign tube, but would want to "die and go to heaven."   LKW: 8:50 am  tpa given?: yes    ROS:  Unable to obtain due to severe dysarthria  Past Medical History  Diagnosis Date  . Angina pectoris   . Hypertension   . Peripheral edema   . Stroke     Social History: Tob: does not smoke  Exam: Current vital signs: BP 123/58 mmHg  Pulse 74  Temp(Src) 97.8 F (36.6 C) (Oral)  Resp 18  Ht _0  (1.448 m)  Wt 43 kg (94 lb 12.8 oz)  BMI 20.51 kg/m2  SpO2 100% Vital signs in last 24 hours: Temp:  [97.7 F (36.5 C)-97.8 F (36.6 C)] 97.8 F (36.6 C) (12/23 1245) Pulse Rate:  [43-91] 74 (12/23 1345) Resp:  [14-23] 18 (12/23 1345) BP: (123-163)/(58-111) 123/58 mmHg (12/23 1345) SpO2:  [86 %-100 %] 100 % (12/23 1400) Weight:  [43 kg (94 lb 12.8 oz)-43.545 kg (96 lb)] 43 kg (94 lb 12.8 oz) (12/23 1345)   Physical Exam  Constitutional: Appears elderly and frail Psych: Affect appropriate to situation Eyes: No scleral injection HENT: No OP obstruction, having difficulty with managing secretions.  Head: Normocephalic.  Cardiovascular: Normal rate and regular rhythm.  Respiratory: Effort normal with crackles at the right base.  GI: Soft.  No distension. There is no tenderness.  Skin: small hematoma on the right calf.  Ext: right ankle swollen and tender.   Neuro: Mental Status: Patient is awake, alert, able to  answer questions with nods/shakes though is severely dysarthric. She follows commands without delay.  No signs of  Neglect. Aphasia difficult to assess due to dysarthria, but understands well and what can be understood makes sense.  Cranial Nerves: II: Initially concerned for right field cut, but endorses finger movement on subsequent testing.  Pupils are equal, round, and reactive to light.  III,IV, VI: EOMI without ptosis or diploplia.  V: Facial sensation is decreased on right.  VII: Facial movement is notable for right facial droop.  VIII: hearing is intact to voice Motor: Tone is normal. Bulk is normal. 5/5 strength was present on the left. She has mild right arm weakness 4+/5 but with some fine motor impairment. Also with weakness of the right leg.  Sensory: Sensation is diminished on right.  Cerebellar: No ataxia.     I have reviewed labs in epic and the results pertinent to this consultation are: cmp -  elevated alk phos Cbc - unremarkable  I have reviewed the images obtained: CT head - no acute infarct.   Impression: 80 yo F with CVA 2/2 atrial fibrillation. Patient is DNR at the patient's request. She will ikely have significant difficulty with swallowing and if this does not improve, may need to be addressed. I discussed that we would likely have to reconsider starting anticoagulation which has been turned down in the past  based on her propensity to fall.   Recommendations: 1. HgbA1c, fasting lipid panel 2. MRI, MRA  of the brain without contrast 3. Frequent neuro checks 4. Echocardiogram 5. Carotid dopplers 6. Prophylactic therapy-Antiplatelet med: Aspirin - dose 380m PO or 3067mPR 7. Risk factor modification 8. Telemetry monitoring 9. PT consult, OT consult, Speech consult 10. Will need to monitor for benzo withdrawal 11. Continue digoxin and lasix IV for chronic chf.     This patient is critically ill and at significant risk of neurological worsening, death  and care requires constant monitoring of vital signs, hemodynamics,respiratory and cardiac monitoring, neurological assessment, discussion with family, other specialists and medical decision making of high complexity. I spent 40 minutes of neurocritical care time  in the care of  this patient.  McRoland RackMD [facility] [phone] 7pm- 7am, please page neurology on call as listed in [place]12/23/2015  3:38 PM

## 2014-12-15 NOTE — Evaluation (Signed)
Speech Language Pathology Evaluation Patient Details Name: Amber Hodges MRN: [id number] DOB: 12/01/1919 Today's Date: 12/15/2014 Time: [id number] SLP Time Calculation (min) (ACUTE ONLY): 23 min  Problem List:  Patient Active Problem List   Diagnosis Date Noted  . 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 Stroke 08/24/2014  . Hypertension   . Essential hypertension, benign 08/22/2014  . Atrial fibrillation 08/22/2014  . Bilateral lower extremity edema 08/22/2014   Past Medical History:  Past Medical History  Diagnosis Date  . Angina pectoris   . Hypertension   . Peripheral edema   . Stroke    Past Surgical History: History reviewed. No pertinent past surgical history. HPI:  80 y.o. female with a PMHx of CHF, HTN, CVA with aphasia August 2015 who presents to the Emergency Department here for sudden onset difficulty speaking and right arm weakness onset 8:50 AM 12/23.  Per notes, pt with increased choking on food last three weeks.     Assessment / Plan / Recommendation Clinical Impression  Pt presents with a severe dysarthria of speech.  Comprehension is [facility] (pt can follow two step commands and discriminate among grouping of line drawings.)  She is able to write single words to dictation; self-generation of written language is more difficult, with presence of paragraphic errors and perseverations.  However, writing is a more effective alternative for communication than speech or using a letter board.  Recommend acute SLP f/u for communication; family present for assessment and recs.      SLP Assessment  Patient needs continued Speech Lanaguage Pathology Services    Follow Up Recommendations   (tba)    Frequency and Duration min 3x week  2 weeks   Pertinent Vitals/Pain Pain Assessment: No/denies pain   SLP Goals  Potential to Achieve Goals (ACUTE ONLY): Good  SLP Evaluation Prior Functioning  Cognitive/Linguistic Baseline: Baseline deficits Baseline deficit  details: mild aphasia; no dysarthria   Cognition  Overall Cognitive Status: Impaired/Different from baseline Arousal/Alertness: Awake/alert Orientation Level: Oriented to person Attention: Focused;Sustained Focused Attention: Appears intact Sustained Attention: Appears intact    Comprehension  Auditory Comprehension Overall Auditory Comprehension: Appears within functional limits for tasks assessed Yes/No Questions: Within Functional Limits Commands:  (able to follow one and two step commands) Conversation: Simple Visual Recognition/Discrimination Discrimination: Within Function Limits    Expression Expression Primary Mode of Expression: Verbal Verbal Expression Overall Verbal Expression: Impaired Automatic Speech: Name;Social Response Level of Generative/Spontaneous Verbalization: Conversation Pragmatics: No impairment Written Expression Dominant Hand: Right Written Expression: Exceptions to [facility]FL Dictation Ability: Word Self Formulation Ability: Word   Oral / Motor Oral Motor/Sensory Function Overall Oral Motor/Sensory Function: Other (comment) (decreased function right CN V,VII, X, XII) Motor Speech Overall Motor Speech: Impaired Respiration: Impaired Level of Impairment: Word Phonation: Wet Resonance: Hypernasality Articulation: Impaired Level of Impairment: Word Intelligibility: Intelligibility reduced Word: 25-49% accurate Phrase: 0-24% accurate Sentence: 0-24% accurate Conversation: 0-24% accurate Motor Planning: Witnin functional limits   GO    Masa Lubin L. Samson Fredericouture, [place]MA CCC/SLP Pager [phone]  Blenda MountsCouture, Jericho Alcorn Laurice 12/15/2014, 4:01 PM

## 2014-12-15 NOTE — ED Provider Notes (Signed)
CSN: [id number]     Arrival date & time 12/15/14  [id number] History   None    This chart was scribed for Doug SouSam Joden Bonsall, MD by Arlan OrganAshley Leger, ED Scribe. This patient was seen in room APA01/APA01 and the patient's care was started 9:42 AM.   Chief Complaint  Patient presents with  . Code Stroke   The history is provided by the EMS personnel.   History also obtained from patient's daughter-in-law who is her caregiver and son LEVEL 5 CAVEAT DUE TO CONDITION  HPI Comments: Joeseph AmorMargaret M Sohm brought in by EMS is a 80 y.o. female with a PMHx of CHF and HTN who presents to the Emergency Department here for sudden onset difficulty speaking and right arm weakness onset 8:50 AM today this morning. Per EMS, pt was found with complete R sided paralysis. Typically at baseline pt is able to talk, patient has been non-ambulatory and not moving her legs for the past 1.5 weeks. Before that she could walk with a walker. Pt presents this morning from her home. Speech is clear at baseline however she's been choking on her food for the past 3 or 4 weeks speech is markedly different since 8:50 AM today  Past Medical History  Diagnosis Date  . Angina pectoris   . Hypertension   . Peripheral edema    multiple strokes No past surgical history on file. No family history on file. History  Substance Use Topics  . Smoking status: Never Smoker   . Smokeless tobacco: Never Used  . Alcohol Use: No   OB History    No data available     Review of Systems  Unable to perform ROS: Acuity of condition      Allergies  Review of patient's allergies indicates no known allergies.  Home Medications   Prior to Admission medications   Medication Sig Start Date End Date Taking? Authorizing Provider  apixaban (ELIQUIS) 2.5 MG TABS tablet Take 1 tablet (2.5 mg total) by mouth 2 (two) times daily. 09/07/14   Erick BlinksJehanzeb Memon, MD  clorazepate (TRANXENE) 15 MG tablet Take 15 mg by mouth 2 (two) times daily.    Historical  Provider, MD  digoxin (LANOXIN) 0.25 MG tablet Take 0.25 mg by mouth daily.    Historical Provider, MD  furosemide (LASIX) 40 MG tablet Take 1 tablet (40 mg total) by mouth daily. 08/24/14   Erick BlinksJehanzeb Memon, MD  isradipine (DYNACIRC) 5 MG capsule Take 10 mg by mouth daily.    Historical Provider, MD  potassium chloride (K-DUR) 10 MEQ tablet Take 10 mEq by mouth 2 (two) times daily.    Historical Provider, MD  ramipril (ALTACE) 10 MG capsule Take 10 mg by mouth daily.    Historical Provider, MD   Triage Vitals: BP 163/72 mmHg  Pulse 89  Resp 20  SpO2 97%   Physical Exam  Constitutional:  Chronically ill appearing  HENT:  Head: Normocephalic and atraumatic.  Eyes: Conjunctivae are normal. Pupils are equal, round, and reactive to light.  Neck: Neck supple. No tracheal deviation present. No thyromegaly present.  Cardiovascular:  No murmur heard. Irregularly irregular  Pulmonary/Chest: Effort normal and breath sounds normal.  Abdominal: Soft. Bowel sounds are normal. She exhibits no distension. There is no tenderness.  Musculoskeletal: Normal range of motion. She exhibits no edema or tenderness.  Neurological: She is alert. Coordination normal.  Motor strength of left upper extremity 5 over 5. She is flaccid in left lower extremity, right lower extremity and right  upper extremity, with motor strength being 0 over 5 in all extremities except for left upper extremity. Speech is slurred, incomprehensible, gag reflex intact. DTRs symmetric bilaterally knee jerk ankle jerk biceps toes neither upward or downward going bilaterally  Skin: Skin is warm and dry. No rash noted.  Psychiatric: She has a normal mood and affect.  Nursing note and vitals reviewed.   ED Course  Procedures (including critical care time)  DIAGNOSTIC STUDIES: Oxygen Saturation is 97% on RA, adequate by my interpretation.    COORDINATION OF CARE: 9:42 AM-Discussed treatment plan with pt at bedside and pt agreed to plan.      Labs Review Labs Reviewed - No data to display  Imaging Review No results found.   EKG Interpretation   Date/Time:  Wednesday December 15 2014 09:59:29 EST Ventricular Rate:  84 PR Interval:    QRS Duration: 92 QT Interval:  326 QTC Calculation: 385 R Axis:   -24 Text Interpretation:  Atrial fibrillation Abnormal R-wave progression,  early transition LVH with secondary repolarization abnormality Baseline  wander in lead(s) V2 Confirmed by Ethelda ChickJACUBOWITZ  MD, Winnell Bento [phone]) on  12/15/2014 10:23:19 AM     Code stroke called in the field. Tele neurology consult obtained. TPA administration discussed with son. He agrees to TPA administration and [facility]. Dr.Kirpatrick accepting physician Results for orders placed or performed during the hospital encounter of 12/15/14  Ethanol  Result Value Ref Range   Alcohol, Ethyl (B) <5 0 - 9 mg/dL  CBC  Result Value Ref Range   WBC 6.5 4.0 - 10.5 K/uL   RBC 4.49 3.87 - 5.11 MIL/uL   Hemoglobin 14.0 12.0 - 15.0 g/dL   HCT 81.141.0 91.436.0 - 78.246.0 %   MCV 91.3 78.0 - 100.0 fL   MCH 31.2 26.0 - 34.0 pg   MCHC 34.1 30.0 - 36.0 g/dL   RDW 95.614.9 21.311.5 - 08.615.5 %   Platelets 188 150 - 400 K/uL  Differential  Result Value Ref Range   Neutrophils Relative % 53 43 - 77 %   Neutro Abs 3.4 1.7 - 7.7 K/uL   Lymphocytes Relative 35 12 - 46 %   Lymphs Abs 2.3 0.7 - 4.0 K/uL   Monocytes Relative 11 3 - 12 %   Monocytes Absolute 0.7 0.1 - 1.0 K/uL   Eosinophils Relative 1 0 - 5 %   Eosinophils Absolute 0.1 0.0 - 0.7 K/uL   Basophils Relative 0 0 - 1 %   Basophils Absolute 0.0 0.0 - 0.1 K/uL  Comprehensive metabolic panel  Result Value Ref Range   Sodium 135 135 - 145 mmol/L   Potassium 4.4 3.5 - 5.1 mmol/L   Chloride 97 96 - 112 mEq/L   CO2 30 19 - 32 mmol/L   Glucose, Bld 97 70 - 99 mg/dL   BUN 18 6 - 23 mg/dL   Creatinine, Ser 5.780.82 0.50 - 1.10 mg/dL   Calcium 9.1 8.4 - 46.910.5  mg/dL   Total Protein 7.3 6.0 - 8.3 g/dL   Albumin 3.8 3.5 - 5.2 g/dL   AST 46 (H) 0 - 37 U/L   ALT 25 0 - 35 U/L   Alkaline Phosphatase 259 (H) 39 - 117 U/L   Total Bilirubin 0.9 0.3 - 1.2 mg/dL   GFR calc non Af Amer 60 (L) >90 mL/min   GFR calc Af Amer 69 (L) >90 mL/min   Anion gap  8 5 - 15  I-Stat Chem 8, ED  Result Value Ref Range   Sodium 134 (L) 135 - 145 mmol/L   Potassium 4.6 3.5 - 5.1 mmol/L   Chloride 97 96 - 112 mEq/L   BUN 18 6 - 23 mg/dL   Creatinine, Ser 1.61 0.50 - 1.10 mg/dL   Glucose, Bld 096 (H) 70 - 99 mg/dL   Calcium, Ion 0.45 4.09 - 1.30 mmol/L   TCO2 26 0 - 100 mmol/L   Hemoglobin 14.3 12.0 - 15.0 g/dL   HCT 81.1 91.4 - 78.2 %  I-stat troponin, ED (not at [facility])  Result Value Ref Range   Troponin i, poc 0.02 0.00 - 0.08 ng/mL   Comment 3          I-stat Creatinine, ED  Result Value Ref Range   Creatinine, Ser 0.90 0.50 - 1.10 mg/dL   Ct Head Wo Contrast  12/15/2014   CLINICAL DATA:  RIGHT-sided weakness. History of 3 prior strokes. Code stroke. Initial encounter.  EXAM: CT HEAD WITHOUT CONTRAST  TECHNIQUE: Contiguous axial images were obtained from the base of the skull through the vertex without intravenous contrast.  COMPARISON:  08/24/2014.  FINDINGS: No evidence for acute infarction, hemorrhage, mass lesion, hydrocephalus, or extra-axial fluid. Generalized atrophy. Chronic microvascular ischemic change affects periventricular and subcortical white matter. Remote LEFT hemisphere infarct was acute on the previous exam. Vascular calcification of a moderate to severe nature affecting the vertebral, basilar, and carotid arteries. Calvarium intact. Retention cyst RIGHT maxillary sinus.  IMPRESSION: Chronic changes as described.  No acute intracranial abnormality.  Critical Value/emergent results were called by telephone at the time of interpretation on 12/15/2014 at 10:10 am to Dr. Doug Sou , who verbally acknowledged these results.   Electronically Signed    By: Davonna Belling M.D.   On: 12/15/2014 10:11   TPA  Infusion initiated here prior to transfer MDM  Risk and potentialbenefits of TPA discussed with son Final diagnoses:  Weakness   Dx: acute ischemic stroke CRITICAL CARE Performed by: Doug Sou Total critical care time: 30 minute Critical care time was exclusive of separately billable procedures and treating other patients. Critical care was necessary to treat or prevent imminent or life-threatening deterioration. Critical care was time spent personally by me on the following activities: development of treatment plan with patient and/or surrogate as well as nursing, discussions with consultants, evaluation of patient's response to treatment, examination of patient, obtaining history from patient or surrogate, ordering and performing treatments and interventions, ordering and review of laboratory studies, ordering and review of radiographic studies, pulse oximetry and re-evaluation of patient's condition.  I personally performed the services described in this documentation, which was scribed in my presence. The recorded information has been reviewed and is accurate.    Doug Sou, MD 12/15/14 [phone]

## 2014-12-15 NOTE — ED Notes (Signed)
Pt comes in with right sided weakness. Last know well was at 0900 today. Pt has history of 3x strokes before. Pt airway intact. NAD noted at this time. Dr. Ethelda ChickJacubowitz assessment made on EMS arrival.

## 2014-12-15 NOTE — Progress Notes (Signed)
VASCULAR LAB PRELIMINARY  PRELIMINARY  PRELIMINARY  PRELIMINARY  Carotid duplex  completed.    Preliminary report:  Bilateral:  1-39% ICA stenosis.  Vertebral artery flow is antegrade.      Yahshua Thibault, RVT 12/15/2014, 1:53 PM

## 2014-12-15 NOTE — H&P (Signed)
Neurology H&P  CC: right sided weakness  History is obtained from:patient  HPI: Amber Hodges is a 80 y.o. female with a history of afib and previous stroke who presents with acute right sided weakness/dysarthria starting earlier today. She was eating breakfast and had a witnessed change at 8:50am. She went to [employer] adn had tpa administered  Also of note, she has not been walking for the past week due to right ankle pain.   I discussed code status with the patient and she states that if something were to happen she would in no circumstance want a breathign tube, but would want to "die and go to heaven."   LKW: 8:50 am  tpa given?: yes    ROS:  Unable to obtain due to severe dysarthria  Past Medical History  Diagnosis Date  . Angina pectoris   . Hypertension   . Peripheral edema   . Stroke     Social History: Tob: does not smoke  Exam: Current vital signs: BP 171/98 mmHg  Pulse 75  Temp(Src) 97.8 F (36.6 C) (Oral)  Resp 17  Ht $R'4\' 9"'NW$  (1.448 m)  Wt 43 kg (94 lb 12.8 oz)  BMI 20.51 kg/m2  SpO2 100% Vital signs in last 24 hours: Temp:  [97.7 F (36.5 C)-97.8 F (36.6 C)] 97.8 F (36.6 C) (12/23 1245) Pulse Rate:  [43-91] 75 (12/23 1445) Resp:  [14-23] 17 (12/23 1445) BP: (99-171)/(58-111) 171/98 mmHg (12/23 1445) SpO2:  [86 %-100 %] 100 % (12/23 1445) Weight:  [43 kg (94 lb 12.8 oz)-43.545 kg (96 lb)] 43 kg (94 lb 12.8 oz) (12/23 1345)   Physical Exam  Constitutional: Appears elderly and frail Psych: Affect appropriate to situation Eyes: No scleral injection HENT: No OP obstruction, having difficulty with managing secretions.  Head: Normocephalic.  Cardiovascular: Normal rate and regular rhythm.  Respiratory: Effort normal with crackles at the right base.  GI: Soft.  No distension. There is no tenderness.  Skin: small hematoma on the right calf.  Ext: right ankle swollen and tender.   Neuro: Mental Status: Patient is awake, alert, able to  answer questions with nods/shakes though is severely dysarthric. She follows commands without delay.  No signs of  Neglect. Aphasia difficult to assess due to dysarthria, but understands well and what can be understood makes sense.  Cranial Nerves: II: Initially concerned for right field cut, but endorses finger movement on subsequent testing.  Pupils are equal, round, and reactive to light.  III,IV, VI: EOMI without ptosis or diploplia.  V: Facial sensation is decreased on right.  VII: Facial movement is notable for right facial droop.  VIII: hearing is intact to voice Motor: Tone is normal. Bulk is normal. 5/5 strength was present on the left. She has mild right arm weakness 4+/5 but with some fine motor impairment. Also with weakness of the right leg.  Sensory: Sensation is diminished on right.  Cerebellar: No ataxia.     I have reviewed labs in epic and the results pertinent to this consultation are: cmp -  elevated alk phos Cbc - unremarkable  I have reviewed the images obtained: CT head - no acute infarct.   Impression: 80 yo F with CVA 2/2 atrial fibrillation. Patient is DNR at the patient's request. She will ikely have significant difficulty with swallowing and if this does not improve, may need to be addressed. I discussed that we would likely have to reconsider starting anticoagulation which has been turned down in the past  based on her propensity to fall.   Recommendations: 1. HgbA1c, fasting lipid panel 2. MRI, MRA  of the brain without contrast 3. Frequent neuro checks 4. Echocardiogram 5. Carotid dopplers 6. Prophylactic therapy-Antiplatelet med: Aspirin - dose $Remo'325mg'dOHSv$  PO or $Rem'300mg'QZBy$  PR 7. Risk factor modification 8. Telemetry monitoring 9. PT consult, OT consult, Speech consult 10. Will need to monitor for benzo withdrawal 11. Continue digoxin and lasix IV for chronic chf.     This patient is critically ill and at significant risk of neurological worsening, death  and care requires constant monitoring of vital signs, hemodynamics,respiratory and cardiac monitoring, neurological assessment, discussion with family, other specialists and medical decision making of high complexity. I spent 40 minutes of neurocritical care time  in the care of  this patient.  Roland Rack, MD [facility] [phone]  If 7pm- 7am, please page neurology on call as listed in [place]. 12/15/2014  3:49 PM

## 2014-12-16 ENCOUNTER — Inpatient Hospital Stay (HOSPITAL_COMMUNITY): Payer: Medicare Other

## 2014-12-16 DIAGNOSIS — E785 Hyperlipidemia, unspecified: Secondary | ICD-10-CM

## 2014-12-16 DIAGNOSIS — I481 Persistent atrial fibrillation: Secondary | ICD-10-CM

## 2014-12-16 DIAGNOSIS — I63412 Cerebral infarction due to embolism of left middle cerebral artery: Secondary | ICD-10-CM

## 2014-12-16 DIAGNOSIS — I10 Essential (primary) hypertension: Secondary | ICD-10-CM

## 2014-12-16 DIAGNOSIS — R600 Localized edema: Secondary | ICD-10-CM

## 2014-12-16 DIAGNOSIS — M25579 Pain in unspecified ankle and joints of unspecified foot: Secondary | ICD-10-CM

## 2014-12-16 LAB — HEMOGLOBIN A1C
Hgb A1c MFr Bld: 5 % (ref <5.7)
Mean Plasma Glucose: 97 mg/dL (ref <117)

## 2014-12-16 LAB — LIPID PANEL
Cholesterol: 160 mg/dL (ref 0–200)
HDL: 36 mg/dL — AB (ref >39)
LDL CALC: 96 mg/dL (ref 0–99)
Total CHOL/HDL Ratio: 4.4 RATIO
Triglycerides: 142 mg/dL (ref <150)
VLDL: 28 mg/dL (ref 0–40)

## 2014-12-16 LAB — DIGOXIN LEVEL: DIGOXIN LVL: 1.1 ng/mL (ref 0.8–2.0)

## 2014-12-16 MED ORDER — POTASSIUM CHLORIDE ER 10 MEQ PO TBCR
10.0000 meq | EXTENDED_RELEASE_TABLET | Freq: Two times a day (BID) | ORAL | Status: DC
  Administered 2014-12-17 (×2): 10 meq via ORAL
  Filled 2014-12-16 (×9): qty 1

## 2014-12-16 MED ORDER — RESOURCE THICKENUP CLEAR PO POWD
ORAL | Status: DC | PRN
  Filled 2014-12-16 (×2): qty 125

## 2014-12-16 MED ORDER — CETYLPYRIDINIUM CHLORIDE 0.05 % MT LIQD
7.0000 mL | Freq: Two times a day (BID) | OROMUCOSAL | Status: DC
  Administered 2014-12-16 – 2014-12-20 (×8): 7 mL via OROMUCOSAL

## 2014-12-16 MED ORDER — DIGOXIN 125 MCG PO TABS
0.2500 mg | ORAL_TABLET | Freq: Every day | ORAL | Status: DC
  Administered 2014-12-17 – 2014-12-20 (×4): 0.25 mg via ORAL
  Filled 2014-12-16 (×4): qty 2

## 2014-12-16 MED ORDER — PRAVASTATIN SODIUM 10 MG PO TABS
10.0000 mg | ORAL_TABLET | Freq: Every day | ORAL | Status: DC
  Administered 2014-12-17 – 2014-12-20 (×4): 10 mg via ORAL
  Filled 2014-12-16 (×5): qty 1

## 2014-12-16 MED ORDER — ASPIRIN EC 81 MG PO TBEC
81.0000 mg | DELAYED_RELEASE_TABLET | Freq: Once | ORAL | Status: AC
Start: 2014-12-16 — End: 2014-12-16
  Administered 2014-12-16: 81 mg via ORAL
  Filled 2014-12-16: qty 1

## 2014-12-16 MED ORDER — PANTOPRAZOLE SODIUM 40 MG PO TBEC
40.0000 mg | DELAYED_RELEASE_TABLET | Freq: Every day | ORAL | Status: DC
  Administered 2014-12-17 – 2014-12-20 (×4): 40 mg via ORAL
  Filled 2014-12-16 (×5): qty 1

## 2014-12-16 MED ORDER — FUROSEMIDE 40 MG PO TABS
40.0000 mg | ORAL_TABLET | Freq: Every day | ORAL | Status: DC
  Administered 2014-12-17 – 2014-12-20 (×4): 40 mg via ORAL
  Filled 2014-12-16 (×4): qty 1

## 2014-12-16 MED ORDER — CLORAZEPATE DIPOTASSIUM 3.75 MG PO TABS
15.0000 mg | ORAL_TABLET | Freq: Two times a day (BID) | ORAL | Status: DC
  Administered 2014-12-16 – 2014-12-20 (×8): 15 mg via ORAL
  Filled 2014-12-16 (×8): qty 4

## 2014-12-16 MED ORDER — APIXABAN 2.5 MG PO TABS
2.5000 mg | ORAL_TABLET | Freq: Two times a day (BID) | ORAL | Status: DC
  Administered 2014-12-17 – 2014-12-20 (×8): 2.5 mg via ORAL
  Filled 2014-12-16 (×10): qty 1

## 2014-12-16 NOTE — Progress Notes (Signed)
PT Cancellation and Discharge Note  Patient Details Name: Amber Hodges MRN: [id number] DOB: 04/20/1919   Cancelled Treatment:    Reason Eval/Treat Not Completed: PT screened, no needs identified, will sign off.  Per family pt was dependent for transfers at baseline and family A with all ADLs.  At this time family plans for pt to return to home with 24hr care and does not wish PT or OT intervention.  Will sign off.     Alban Marucci, Alison MurrayMegan F 12/16/2014, 11:09 AM

## 2014-12-16 NOTE — Progress Notes (Signed)
Bilateral lower extremity venous duplex completed:  No evidence of DVT, superficial thrombosis, or Baker's cyst.   

## 2014-12-16 NOTE — Progress Notes (Signed)
Speech Language Pathology Treatment:    Patient Details Name: Amber AmorMargaret M Mcneese MRN: [id number] DOB: 09/05/1919 Today's Date: 12/16/2014 Time: [id number] SLP Time Calculation (min) (ACUTE ONLY): 26 min  Assessment / Plan / Recommendation Clinical Impression  Pt. Seen for ability to initiate po's.  Immediate s/s aspiration with thin liquids with daughter-in-law reporting frequent coughing during meals and significant coughing episode Fri on oral secretions. Palpation during swallow revealed a weak, sluggish audible swallow. Honey thick liquids decreased s/s aspiration but not completely eliminated Educated daughter re: the chronic risk with any po's, however puree and honey thick appeared safer. She agreed MBS not necessary at this time. Recommend Dys 1 and honey thick liquids.  Educated daughter-in-law to allow pt to self feed as long as able during meals.  ST will continue intervention.   HPI HPI: 80 y.o. female with a history of afib and previous stroke (8/15) who presents with acute right sided weakness/dysarthria starting earlier today. Transferred from [facility] where TPA had been admitted. Daughter-in-law states CT at [facility] was positve for CVA. MRI pending. Daughter-in-law reports frequent coughing during meals and a significant coughing episode Friday.  No prior ST swallow evals found.  CXR No active disease. Mild left basilar atelectasis.   Pertinent Vitals Pain Assessment: No/denies pain  SLP Plan  Continue with current plan of care    Recommendations Diet recommendations: Dysphagia 1 (puree);Honey-thick liquid Liquids provided via: Cup;No straw Medication Administration: Crushed with puree Supervision: Patient able to self feed;Full supervision/cueing for compensatory strategies;Staff to assist with self feeding Compensations: Slow rate;Small sips/bites;Multiple dry swallows after each bite/sip Postural Changes and/or Swallow Maneuvers: Seated upright 90 degrees              Oral Care  Recommendations: Oral care BID Follow up Recommendations:  (TBD) Plan: Continue with current plan of care    GO     Royce MacadamiaLitaker, Surafel Hilleary Willis 12/16/2014, 10:39 AM  Breck CoonsLisa Willis Lonell FaceLitaker M.Ed [employer]-SLP Pager [phone]

## 2014-12-16 NOTE — Plan of Care (Addendum)
Had a long discussion with daughter about starting eliquis. She has a lot of questions and concerns regarding the medication but finally agrees for pt to take it but she prefer to start in am when her brother is here at bedside with the pt. Will hold off tonight eliquis and cover with one dose of ASA 81mg .   Marvel PlanJindong Sheelah Ritacco, MD PhD Stroke Neurology 12/16/2014 5:24 PM

## 2014-12-16 NOTE — Progress Notes (Signed)
STROKE TEAM PROGRESS NOTE   HISTORY Amber Hodges is a 80 y.o. female with a history of afib and previous stroke who presents with acute right sided weakness/dysarthria starting earlier today. She was eating breakfast and had a witnessed change at 8:50am. She went to [employer] adn had tpa administered  Also of note, she has not been walking for the past week due to right ankle pain.   I discussed code status with the patient and she states that if something were to happen she would in no circumstance want a breathign tube, but would want to "die and go to heaven."   LKW: 8:50 am  tpa given?: yes  SUBJECTIVE (INTERVAL HISTORY) Her son and granddaughter are at the bedside.  Overall she feels her condition is gradually improving. Pt has severe dysarthria and I have to get more history from his son and later the daughter at bedside. From what I got, pt was functional at home with a 24hour HH aide. She was able to walk with walker until 2 weeks ago due to pain in the left LE. She had stroke in 07/2014 and thought to be due to afib and was put on eliquis 2.5mg  bid. However, daughter took the medication off due to fear of side effects. Pt was not on the eliquis at home. However, son is requesting to start her on eliquis.   OBJECTIVE Temp:  [97.4 F (36.3 C)-98 F (36.7 C)] 97.6 F (36.4 C) (12/24 1200) Pulse Rate:  [39-128] 78 (12/24 1200) Cardiac Rhythm:  [-] Atrial fibrillation (12/24 0800) Resp:  [17-26] 22 (12/24 1200) BP: (99-171)/(46-124) 132/51 mmHg (12/24 1200) SpO2:  [86 %-100 %] 90 % (12/24 1200) Weight:  [94 lb 12.8 oz (43 kg)] 94 lb 12.8 oz (43 kg) (12/23 1345)  No results for input(s): GLUCAP in the last 168 hours.  Recent Labs Lab 12/15/14 0948 12/15/14 0951 12/15/14 1016  NA  --  134* 135  K  --  4.6 4.4  CL  --  97 97  CO2  --   --  30  GLUCOSE  --  103* 97  BUN  --  18 18  CREATININE 0.90 1.00 0.82  CALCIUM  --   --  9.1    Recent Labs Lab 12/15/14 1016   AST 46*  ALT 25  ALKPHOS 259*  BILITOT 0.9  PROT 7.3  ALBUMIN 3.8    Recent Labs Lab 12/15/14 0951 12/15/14 1016  WBC  --  6.5  NEUTROABS  --  3.4  HGB 14.3 14.0  HCT 42.0 41.0  MCV  --  91.3  PLT  --  188   No results for input(s): CKTOTAL, CKMB, CKMBINDEX, TROPONINI in the last 168 hours.  Recent Labs  12/15/14 1016  LABPROT 13.7  INR 1.04   No results for input(s): COLORURINE, LABSPEC, PHURINE, GLUCOSEU, HGBUR, BILIRUBINUR, KETONESUR, PROTEINUR, UROBILINOGEN, NITRITE, LEUKOCYTESUR in the last 72 hours.  Invalid input(s): APPERANCEUR     Component Value Date/Time   CHOL 160 12/16/2014 0218   TRIG 142 12/16/2014 0218   HDL 36* 12/16/2014 0218   CHOLHDL 4.4 12/16/2014 0218   VLDL 28 12/16/2014 0218   LDLCALC 96 12/16/2014 0218   Lab Results  Component Value Date   HGBA1C 5.2 08/22/2014      Component Value Date/Time   LABOPIA NONE DETECTED 08/22/2014 1249   COCAINSCRNUR NONE DETECTED 08/22/2014 1249   LABBENZ POSITIVE* 08/22/2014 1249   AMPHETMU NONE DETECTED 08/22/2014 1249  THCU NONE DETECTED 08/22/2014 1249   LABBARB NONE DETECTED 08/22/2014 1249     Recent Labs Lab 12/15/14 1016  ETH <5   I have personally reviewed the radiological images below and agree with the radiology interpretations.  Ct Head Wo Contrast 12/16/2014  IMPRESSION: Stable non contrast CT appearance of the brain compared to 02/15/2014. No acute cortically based infarct or acute intracranial abnormality identified.    12/15/2014    IMPRESSION: Chronic changes as described.  No acute intracranial abnormality.    Dg Chest Port 1 View 12/15/2014   IMPRESSION: No active disease.  Mild left basilar atelectasis.      Dg Ankle Right Port 12/15/2014   IMPRESSION: 1. No acute fracture, malalignment or osseous abnormality. 2. Osteopenia.      CUS - Bilateral: 1-39% ICA stenosis. Vertebral artery flow is antegrade.  LE DVT - negative bilaterally  2D echo - Left ventricle: The  cavity size was normal. Wall thickness was increased in a pattern of moderate LVH. Systolic function was normal. The estimated ejection fraction was in the range of 55% to 60%. The study was not technically sufficient to allow evaluation of LV diastolic dysfunction due to atrial fibrillation. - Aortic valve: Mildly calcified annulus. Trileaflet; mildly thickened leaflets. There was moderate regurgitation. Valve area (VTI): 1.76 cm^2. Valve area (Vmax): 1.75 cm^2. - Mitral valve: Moderately calcified annulus. Mildly thickened leaflets . There was mild regurgitation. - Left atrium: The atrium was severely dilated. - Right atrium: The atrium was severely dilated. - Technically adequate study.  PHYSICAL EXAM  Temp:  [97.4 F (36.3 C)-98 F (36.7 C)] 97.6 F (36.4 C) (12/24 1200) Pulse Rate:  [39-128] 78 (12/24 1200) Resp:  [17-26] 22 (12/24 1200) BP: (99-171)/(46-124) 132/51 mmHg (12/24 1200) SpO2:  [86 %-100 %] 90 % (12/24 1200) Weight:  [94 lb 12.8 oz (43 kg)] 94 lb 12.8 oz (43 kg) (12/23 1345)  General - Well nourished, well developed, in no apparent distress.  Ophthalmologic - not able to see the fundi due to lack of cooperation.  Cardiovascular - irregularly irregular heart rate and rhythm.  Mental Status -  Level of arousal and orientation to place, and person were intact, but not orientated to time. Language including expression, comprehension was assessed and found intact, however, severe dysarthria and naming and repetition not able to test.  Cranial Nerves II - XII - II - blink to threat bilaterally. III, IV, VI - Extraocular movements intact. V - Facial sensation intact bilaterally. VII - left facial droop. VIII - Hard of hearing & vestibular intact bilaterally. X - Palate elevates symmetrically. XI - Chin turning & shoulder shrug intact bilaterally. XII - Tongue protrusion intact.  Motor Strength - The patient's strength was 5-/5 in upper  extremities, 4/5 LLE but 2/5 RLE both proximal and distal and pronator drift was absent.  Bulk was decreased in all extremities and fasciculations were absent.   Motor Tone - Muscle tone was assessed at the neck and appendages and was normal.  Reflexes - The patient's reflexes were 1+ in all extremities and she had no pathological reflexes.  Sensory - Light touch, temperature/pinprick were assessed and were normal.    Coordination - The patient had normal movements in the hands with no ataxia or dysmetria.  Tremor was absent.  Gait and Station - not able to test.  ASSESSMENT/PLAN Ms. Joeseph AmorMargaret M Cowman is a 80 y.o. female with [facility] of [facility] and previous left temporal stroke in 07/2014  was admitted for acute onset dysarthria and left LE weakness. S/p TPA and dysarthria slightly better.    Stroke:  Right hemisphere embolic stroke, may involve right MCA and ACA territory. Pt refused MRI due to claustrophobia. CT no acute findings except left temporal infarct in 07/2014.   Resultant  Left facial droop, dysarthria and left LE weakness.  MRI and MRA not done as pt refused due to claustrophia  Carotid Doppler  unremarkable  2D Echo  unremarkable  Repeat CT did not show any bleeding or obvious acute infarct. Old left temporal infarct present  LDL 96, not at goal  HgbA1c 5.2, at the goal  eliquis for VTE prophylaxis  DIET - DYS 1   no antithrombotic prior to admission, now on eliquis (apixaban) 2.5mg  bid  Patient counseled to be compliant with her antithrombotic medications  Ongoing aggressive stroke risk factor management  Therapy recommendations:  pending  Disposition:  Pending  Transfer to floor today  Afib  Pt was not on eliquis since last discharge as daughter fear of bleeding  Rate controlled now  After discussion with son and daughter, will put on eliquis 2.5mg  bid  Family understand that pt on eliquis has higher risk of bleeding and the bleeding could be  anywhere, however, after weighing risk and benefit, family agree with anticoagulation with eliquis.  Continue home meds with digoxin and lasix  Hypertension  Home meds:   Ramipril and isradipine  Stable Permissive hypertension (OK if <220/120) for 24-48 hours post stroke and then gradually normalized within 5-7 days. Hold off ramipril and isradipine for now.  Patient counseled to be compliant with her blood pressure medications  Hyperlipidemia  Home meds:  None  LDL 96, goal < 70  Add pravastatin 10mg  daily  Continue statin at discharge  Left LE pain - X-ray ruled out fracture - venous doppler negative for DVT - family stated that pt has it for about 2 weeks and getting better - ice pack - close monitor  Other Stroke Risk Factors  Advanced age  Hx stroke/TIA - 07/2014 left temporal stroke  Other Active Problems  Dysarthria - passed swallow  Hospital day # 1  Marvel PlanJindong Roxsana Riding, MD PhD Stroke Neurology 12/16/2014 1:43 PM      To contact Stroke Continuity provider, please refer to [email or web address]. After hours, contact General Neurology

## 2014-12-16 NOTE — Progress Notes (Signed)
OT Cancellation Note  Patient Details Name: Joeseph AmorMargaret M Lopezmartinez MRN: [id number] DOB: 02/17/1919   Cancelled Treatment:    Reason Eval/Treat Not Completed: OT screened, no needs identified, will sign off. Family is not interested in PT or OT at this time.  Pt is dependent in transfers and non ambulatory at baseline.  She is able to assist with grooming at bedlevel and feed herself as prior to admission.  Signing off.  Evern BioMayberry, Alma Muegge Lynn 12/16/2014, 11:02 AM

## 2014-12-16 NOTE — Progress Notes (Signed)
OT Cancellation Note  Patient Details Name: Joeseph AmorMargaret M Finnicum MRN: [id number] DOB: 04/20/1919   Cancelled Treatment:    Reason Eval/Treat Not Completed: Patient not medically ready. Pt with strict bed rest order.  Will initiate OT when upgraded.  Evern BioMayberry, Evelyn Moch Lynn 12/16/2014, 8:31 AM

## 2014-12-17 NOTE — Progress Notes (Addendum)
Occupational Therapy Evaluation Patient Details Name: Amber Hodges MRN: [id number] DOB: 04/09/1919 Today's Date: 12/17/2014    History of Present Illness 80 y.o. female with a history of afib and previous stroke who presents with acute right sided weakness/dysarthria    Clinical Impression   PTA, pt/family reports that pt has been total care for the past 2-3 weeks. Long discussion regarding pt potential and D/C needs with pt/son.  Son realizes that pt is total care for ADL and mobility. Son states caregivers can provide this level of care. However, if caregivers are unable to total lift pt, rec renting a hoyer to use wit pt.  Discussed recommendations for w/c with removable arm rests and elevating leg rests and hospital bed. Son plans to measure doorways and assess if w/c and hospital bed are feasible. He plans to get this DME if needed after D/C. Also educated son on pt care regarding positioning for contracture management and skin care, in addition to proper and safe positioning for feeding pt. Contacted ST who plans to see pt in am to make follow up recommendations regarding diet. Pt expressing her wish to go home so she can die in her own bed. Nsg made aware. Palliative consult may be appropriate. Son very appreciative of help.     Follow Up Recommendations  No OT follow up;Supervision/Assistance - 24 hour  Palliative consult   Equipment Recommendations  Wheelchair (measurements OT);Hospital bed (18(width)x14 (leg length) with cushion)  Hospital bed  - son aware of recommendations; hoyer lift   Recommendations for Other Services       Precautions / Restrictions Precautions Precautions: Fall;Other (comment) (skin breakdown)      Mobility Bed Mobility Overal bed mobility: Needs Assistance Bed Mobility: Supine to Sit;Sit to Supine     Supine to sit: Total assist Sit to supine: Total assist      Transfers                 General transfer comment: total lift  +2    Balance Overall balance assessment: Needs assistance   Sitting balance-Leahy Scale: Zero                                      ADL Overall ADL's : At baseline                                       General ADL Comments: total A for all ADL. Educated pt/son on need for pt to be upright when eating/drinking. Encouraged pt to feed self. Reviewed swallowing precaution recommendations from ST.      Vision                     Perception     Praxis      Pertinent Vitals/Pain Pain Assessment: Faces Faces Pain Scale: Hurts even more Pain Location: R leg Pain Descriptors / Indicators: Grimacing     Hand Dominance Right   Extremity/Trunk Assessment Upper Extremity Assessment Upper Extremity Assessment: Generalized weakness   Lower Extremity Assessment Lower Extremity Assessment: Generalized weakness;RLE deficits/detail (knee and ankle contractures; no change from baseline)   Cervical / Trunk Assessment Cervical / Trunk Assessment: Kyphotic   Communication Communication Communication: Expressive difficulties (dysarthric)   Cognition Arousal/Alertness: Awake/alert Behavior During Therapy: WFL for tasks assessed/performed Overall Cognitive Status:  History of cognitive impairments - at baseline                     General Comments       Exercises       Shoulder Instructions      Home Living Family/patient expects to be discharged to:: Private residence Living Arrangements: Other relatives;Non-relatives/Friends Available Help at Discharge: Available 24 hours/day;Personal care attendant Type of Home: House Home Access: Stairs to enter [employer] CorporationEntrance Stairs-Number of Steps: 2   Home Layout: One level               Home Equipment: Walker - 2 wheels;Other (comment) (transport chair)          Prior Functioning/Environment Level of Independence: Independent with assistive device(s);Needs assistance  Gait /  Transfers Assistance Needed: Family reports pt is typically mod (I) with bed mobility skills, transfers, and household amb with use of rollator.  ADL's / Homemaking Assistance Needed: Pt assisted with bathing/dressing until @ 2weeks ago. Ambulated to bathroom @ mod I level   Comments: Was ambulating  with RW until @ 2 weeks ago and now using a w/c for mobility. No bed - w/c - sofa - w/c - bed    OT Diagnosis: Generalized weakness   OT Problem List: Decreased strength;Decreased range of motion;Decreased activity tolerance;Impaired balance (sitting and/or standing);Decreased coordination;Impaired UE functional use;Pain   OT Treatment/Interventions:      OT Goals(Current goals can be found in the care plan section) Acute Rehab OT Goals Patient Stated Goal: to go home and die OT Goal Formulation: All assessment and education complete, DC therapy  OT Frequency:     Barriers to D/C:            Co-evaluation              End of Session Nurse Communication: Mobility status;Patient requests pain meds  Activity Tolerance: Patient limited by fatigue Patient left: in bed;with call bell/phone within reach;with family/visitor present   Time: 1240-1330 OT Time Calculation (min): 50 min Charges:  OT General Charges $OT Visit: 1 Procedure OT Evaluation $Initial OT Evaluation Tier I: 1 Procedure OT Treatments $Self Care/Home Management : 8-22 mins $Therapeutic Activity: 8-22 mins G-Codes:    Abigaile Rossie,HILLARY 12/17/2014, 1:59 PM   [facility] Surgery Center LLCilary Ajee Heasley, OTR/L  [phone] 12/17/2014

## 2014-12-17 NOTE — Progress Notes (Signed)
Medication Related Note Pharmacy Re: Digoxin Dosing Indication: Afib  Assessment: Very small 80 yo female (43kg) with estimated crcl of 7426ml/min.  She has been receiving oral Digoxin 0.25 mg daily for her Afib.  Her level was 1.1 mcg/ml on admission which is within the desired goal range of 0.8-1.5 mcg/ml.  Estimated population based kinetics dosing would indicate that she requires a dose of 0.125 mg daily to maintain her within this range as well.  Not sure about her compliance prior to admit, but would consider monitoring her Digoxin level again once she has been on it a couple of days here before discharge to ensure appropriate clearance.  Recommendation:  1.  Consider rechecking a Digoxin level on Monday to ensure she is clearing appropriately.  No Known Allergies  Patient Measurements: Height: 4\' 9"  (144.8 cm) Weight: 94 lb 12.8 oz (43 kg) IBW/kg (Calculated) : 38.6  Labs:  Recent Labs  12/15/14 0948 12/15/14 0951 12/15/14 1016  WBC  --   --  6.5  HGB  --  14.3 14.0  PLT  --   --  188  CREATININE 0.90 1.00 0.82   Estimated Creatinine Clearance: 26.1 mL/min (by C-G formula based on Cr of 0.82).  Medical History: Past Medical History  Diagnosis Date  . Angina pectoris   . Hypertension   . Peripheral edema   . Stroke    Nadara MustardNita Katelind Pytel, PharmD., MS Clinical Pharmacist Pager:  [phone] Thank you for allowing pharmacy to be part of this patients care team. 12/17/2014,1:06 PM

## 2014-12-17 NOTE — Progress Notes (Addendum)
STROKE TEAM PROGRESS NOTE   HISTORY Amber Hodges is a 80 y.o. female with a history of afib and previous stroke who presents with acute right sided weakness/dysarthria starting earlier today. She was eating breakfast and had a witnessed change at 8:50am. [facility] [employer] [facility] had [facility]pa administered  Also of note, she has not been walking for the past week due to right ankle pain.   I discussed code status with the patient and she states that if something were to happen she would in no circumstance want a breathign tube, but would want to "die and go to heaven."   LKW: 8:50 am  tpa given?: yes  SUBJECTIVE (INTERVAL HISTORY) Daughter is at the bedside. She says they would like PT/OT and evaluation for rehab after admission. She is waiting for her brother to come today. Patient says she wants to go back home. Will ask PT to reevaluate.     OBJECTIVE Temp:  [97.4 F (36.3 C)-97.6 F (36.4 C)] 97.6 F (36.4 C) (12/25 0551) Pulse Rate:  [70-121] 81 (12/25 0551) Cardiac Rhythm:  [-] Atrial fibrillation (12/25 0400) Resp:  [18-22] 20 (12/25 0551) BP: (121-137)/(51-59) 126/54 mmHg (12/25 0551) SpO2:  [90 %-96 %] 94 % (12/25 0551)  No results for input(s): GLUCAP in the last 168 hours.  Recent Labs Lab 12/15/14 0948 12/15/14 0951 12/15/14 1016  NA  --  134* 135  K  --  4.6 4.4  CL  --  97 97  CO2  --   --  30  GLUCOSE  --  103* 97  BUN  --  18 18  CREATININE 0.90 1.00 0.82  CALCIUM  --   --  9.1    Recent Labs Lab 12/15/14 1016  AST 46*  ALT 25  ALKPHOS 259*  BILITOT 0.9  PROT 7.3  ALBUMIN 3.8    Recent Labs Lab 12/15/14 0951 12/15/14 1016  WBC  --  6.5  NEUTROABS  --  3.4  HGB 14.3 14.0  HCT 42.0 41.0  MCV  --  91.3  PLT  --  188   No results for input(s): CKTOTAL, CKMB, CKMBINDEX, TROPONINI in the last 168 hours.  Recent Labs  12/15/14 1016  LABPROT 13.7  INR 1.04   No results for input(s): COLORURINE, LABSPEC, PHURINE, GLUCOSEU, HGBUR,  BILIRUBINUR, KETONESUR, PROTEINUR, UROBILINOGEN, NITRITE, LEUKOCYTESUR in the last 72 hours.  Invalid input(s): APPERANCEUR     Component Value Date/Time   CHOL 160 12/16/2014 0218   TRIG 142 12/16/2014 0218   HDL 36* 12/16/2014 0218   CHOLHDL 4.4 12/16/2014 0218   VLDL 28 12/16/2014 0218   LDLCALC 96 12/16/2014 0218   Lab Results  Component Value Date   HGBA1C 5.0 12/16/2014      Component Value Date/Time   LABOPIA NONE DETECTED 08/22/2014 1249   COCAINSCRNUR NONE DETECTED 08/22/2014 1249   LABBENZ POSITIVE* 08/22/2014 1249   AMPHETMU NONE DETECTED 08/22/2014 1249   THCU NONE DETECTED 08/22/2014 1249   LABBARB NONE DETECTED 08/22/2014 1249     Recent Labs Lab 12/15/14 1016  ETH <5   I have personally reviewed the radiological images below and agree with the radiology interpretations.  Ct Head Wo Contrast 12/16/2014  IMPRESSION: Stable non contrast CT appearance of the brain compared to 02/15/2014. No acute cortically based infarct or acute intracranial abnormality identified.    12/15/2014    IMPRESSION: Chronic changes as described.  No acute intracranial abnormality.    Dg Chest  Port 1 View 12/15/2014   IMPRESSION: No active disease.  Mild left basilar atelectasis.      Dg Ankle Right Port 12/15/2014   IMPRESSION: 1. No acute fracture, malalignment or osseous abnormality. 2. Osteopenia.      CUS - Bilateral: 1-39% ICA stenosis. Vertebral artery flow is antegrade.  LE DVT - negative bilaterally  2D echo - Left ventricle: The cavity size was normal. Wall thickness was increased in a pattern of moderate LVH. Systolic function was normal. The estimated ejection fraction was in the range of 55% to 60%. The study was not technically sufficient to allow evaluation of LV diastolic dysfunction due to atrial fibrillation. - Aortic valve: Mildly calcified annulus. Trileaflet; mildly thickened leaflets. There was moderate regurgitation. Valve area (VTI):  1.76 cm^2. Valve area (Vmax): 1.75 cm^2. - Mitral valve: Moderately calcified annulus. Mildly thickened leaflets . There was mild regurgitation. - Left atrium: The atrium was severely dilated. - Right atrium: The atrium was severely dilated. - Technically adequate study.  PHYSICAL EXAM  Temp:  [97.4 F (36.3 C)-97.6 F (36.4 C)] 97.6 F (36.4 C) (12/25 0551) Pulse Rate:  [70-121] 81 (12/25 0551) Resp:  [18-22] 20 (12/25 0551) BP: (121-137)/(51-59) 126/54 mmHg (12/25 0551) SpO2:  [90 %-96 %] 94 % (12/25 0551)  General - Well nourished, well developed, in no apparent distress.  Ophthalmologic - not able to see the fundi due to lack of cooperation.  Cardiovascular - irregularly irregular heart rate and rhythm.  Neuro: Exam remains unchanged/stable Mental Status -  Level of arousal and orientation to place, and person were intact, but not orientated to time. Language including expression, comprehension was assessed and found intact, however, severe dysarthria and naming and repetition not able to test.  Cranial Nerves II - XII - II - blink to threat bilaterally. III, IV, VI - Extraocular movements intact. V - Facial sensation intact bilaterally. VII - left facial droop. VIII - Hard of hearing & vestibular intact bilaterally. X - Palate elevates symmetrically. XI - Chin turning & shoulder shrug intact bilaterally. XII - Tongue protrusion intact.  Motor Strength - The patient's strength was 5-/5 in upper extremities, 4/5 LLE but 2/5 RLE both proximal and distal and pronator drift was absent.  Bulk was decreased in all extremities and fasciculations were absent.   Motor Tone - Muscle tone was assessed at the neck and appendages and was normal.  Reflexes - The patient's reflexes were 1+ in all extremities and she had no pathological reflexes.  Sensory - Light touch, temperature/pinprick were assessed and were normal.    Coordination - The patient had normal movements in the  hands with no ataxia or dysmetria.  Tremor was absent.  Gait and Station - not able to test.  ASSESSMENT/PLAN Amber Hodges is a 80 y.o. female with history of afib not on [facility] and previous left temporal stroke in 07/2014 was admitted for acute onset dysarthria and left LE weakness. S/p TPA and dysarthria slightly better.    Stroke:  Right hemisphere embolic stroke s/p IV tPA, may involve right MCA and ACA territory. Pt refused MRI due to claustrophobia. CT no acute findings except left temporal infarct in 07/2014.   Resultant  Left facial droop, dysarthria and left LE weakness.  MRI and MRA not done as pt refused due to claustrophia  Carotid Doppler  unremarkable  2D Echo  unremarkable  Repeat CT did not show any bleeding or obvious acute infarct. Old left temporal infarct present  LDL 96, not at goal  HgbA1c 5.2, at the goal  eliquis for VTE prophylaxis  DIET - DYS 1   no antithrombotic prior to admission, now on eliquis (apixaban) 2.5mg  bid  Patient counseled to be compliant with her antithrombotic medications  Ongoing aggressive stroke risk factor management  Therapy recommendations:  No therapy needs  Disposition:  Pending  Afib  Pt was not on eliquis since last discharge as daughter fear of bleeding  Rate controlled now  After discussion with son and daughter, will put on eliquis 2.5mg  bid  Family understand that pt on eliquis has higher risk of bleeding and the bleeding could be anywhere, however, after weighing risk and benefit, family agree with anticoagulation with eliquis.  Continue home meds with digoxin and lasix  Hypertension  Home meds:   Ramipril and isradipine  Stable Permissive hypertension (OK if <220/120) for 24-48 hours post stroke and then gradually normalized within 5-7 days. Hold off ramipril and isradipine for now.  Patient counseled to be compliant with her blood pressure medications  Hyperlipidemia  Home meds:  None  LDL  96, goal < 70  Add pravastatin 10mg  daily  Continue statin at discharge  Left LE pain - X-ray ruled out fracture - venous doppler negative for DVT - family stated that pt has it for about 2 weeks and getting better - ice pack - close monitor  Other Stroke Risk Factors  Advanced age  Hx stroke/TIA - 07/2014 left temporal stroke  Other Active Problems  Dysarthria - passed swallow  Hospital day # 2  Personally examined patient and images  Naomie DeanAntonia Ahern, MD [facility]       To contact Stroke Continuity provider, please refer to [email or web address]. After hours, contact General Neurology

## 2014-12-18 MED ORDER — POTASSIUM CHLORIDE 20 MEQ/15ML (10%) PO SOLN
10.0000 meq | Freq: Two times a day (BID) | ORAL | Status: DC
  Administered 2014-12-18 – 2014-12-20 (×3): 10 meq via ORAL
  Filled 2014-12-18 (×6): qty 7.5

## 2014-12-18 NOTE — Progress Notes (Signed)
STROKE TEAM PROGRESS NOTE   HISTORY Amber Hodges is a 80 y.o. female with a history of afib and previous stroke who presents with acute right sided weakness/dysarthria starting earlier today. She was eating breakfast and had a witnessed change at 8:50am. She went to [employer] adn had tpa administered  Also of note, she has not been walking for the past week due to right ankle pain.   I discussed code status with the patient and she states that if something were to happen she would in no circumstance want a breathign tube, but would want to "die and go to heaven."   LKW: 8:50 am  tpa given?: yes  SUBJECTIVE (INTERVAL HISTORY) Daughter is at the bedside. She says they would like to bring patient home as she has 24x7 care. Patient says she wants to go back home. PT has recommended a hoyer lift, hospital bed and wheelchair and case management is in the process of getting it ready hopefully tomorrow.    OBJECTIVE Temp:  [97.4 F (36.3 C)-97.7 F (36.5 C)] 97.6 F (36.4 C) (12/26 1005) Pulse Rate:  [64-92] 92 (12/26 1005) Cardiac Rhythm:  [-] Atrial fibrillation (12/25 2000) Resp:  [16-18] 18 (12/26 1005) BP: (114-147)/(49-93) 114/93 mmHg (12/26 1005) SpO2:  [93 %-96 %] 95 % (12/26 1005)  No results for input(s): GLUCAP in the last 168 hours.  Recent Labs Lab 12/15/14 0948 12/15/14 0951 12/15/14 1016  NA  --  134* 135  K  --  4.6 4.4  CL  --  97 97  CO2  --   --  30  GLUCOSE  --  103* 97  BUN  --  18 18  CREATININE 0.90 1.00 0.82  CALCIUM  --   --  9.1    Recent Labs Lab 12/15/14 1016  AST 46*  ALT 25  ALKPHOS 259*  BILITOT 0.9  PROT 7.3  ALBUMIN 3.8    Recent Labs Lab 12/15/14 0951 12/15/14 1016  WBC  --  6.5  NEUTROABS  --  3.4  HGB 14.3 14.0  HCT 42.0 41.0  MCV  --  91.3  PLT  --  188   No results for input(s): CKTOTAL, CKMB, CKMBINDEX, TROPONINI in the last 168 hours. No results for input(s): LABPROT, INR in the last 72 hours. No results for  input(s): COLORURINE, LABSPEC, PHURINE, GLUCOSEU, HGBUR, BILIRUBINUR, KETONESUR, PROTEINUR, UROBILINOGEN, NITRITE, LEUKOCYTESUR in the last 72 hours.  Invalid input(s): APPERANCEUR     Component Value Date/Time   CHOL 160 12/16/2014 0218   TRIG 142 12/16/2014 0218   HDL 36* 12/16/2014 0218   CHOLHDL 4.4 12/16/2014 0218   VLDL 28 12/16/2014 0218   LDLCALC 96 12/16/2014 0218   Lab Results  Component Value Date   HGBA1C 5.0 12/16/2014      Component Value Date/Time   LABOPIA NONE DETECTED 08/22/2014 1249   COCAINSCRNUR NONE DETECTED 08/22/2014 1249   LABBENZ POSITIVE* 08/22/2014 1249   AMPHETMU NONE DETECTED 08/22/2014 1249   THCU NONE DETECTED 08/22/2014 1249   LABBARB NONE DETECTED 08/22/2014 1249     Recent Labs Lab 12/15/14 1016  ETH <5   I have personally reviewed the radiological images below and agree with the radiology interpretations.  Ct Head Wo Contrast 12/16/2014  IMPRESSION: Stable non contrast CT appearance of the brain compared to 02/15/2014.  No acute cortically based infarct or acute intracranial abnormality identified.    12/15/2014     IMPRESSION: Chronic changes as described.  No acute intracranial abnormality.    Dg Chest Port 1 View 12/15/2014   IMPRESSION: No active disease.  Mild left basilar atelectasis.      Dg Ankle Right Port 12/15/2014   IMPRESSION: 1. No acute fracture, malalignment or osseous abnormality. 2. Osteopenia.      CUS - Bilateral: 1-39% ICA stenosis. Vertebral artery flow is antegrade.  LE DVT - negative bilaterally  2D echo - Left ventricle: The cavity size was normal. Wall thickness was increased in a pattern of moderate LVH. Systolic function was normal. The estimated ejection fraction was in the range of 55% to 60%. The study was not technically sufficient to allow evaluation of LV diastolic dysfunction due to atrial fibrillation. - Aortic valve: Mildly calcified annulus. Trileaflet; mildly thickened  leaflets. There was moderate regurgitation. Valve area (VTI): 1.76 cm^2. Valve area (Vmax): 1.75 cm^2. - Mitral valve: Moderately calcified annulus. Mildly thickened leaflets . There was mild regurgitation. - Left atrium: The atrium was severely dilated. - Right atrium: The atrium was severely dilated. - Technically adequate study.  PHYSICAL EXAM  Temp:  [97.4 F (36.3 C)-97.7 F (36.5 C)] 97.6 F (36.4 C) (12/26 1005) Pulse Rate:  [64-92] 92 (12/26 1005) Resp:  [16-18] 18 (12/26 1005) BP: (114-147)/(49-93) 114/93 mmHg (12/26 1005) SpO2:  [93 %-96 %] 95 % (12/26 1005)  General - Well nourished, well developed, in no apparent distress.  Ophthalmologic - not able to see the fundi due to lack of cooperation.  Cardiovascular - irregularly irregular heart rate and rhythm.  Neuro: Exam remains unchanged/stable Mental Status -  Level of arousal and orientation to place, and person were intact, but not orientated to time. Language including expression, comprehension was assessed and found intact, however, severe dysarthria and naming and repetition not able to test.  Cranial Nerves II - XII - II - blink to threat bilaterally. III, IV, VI - Extraocular movements intact. V - Facial sensation intact bilaterally. VII - left facial droop. VIII - Hard of hearing & vestibular intact bilaterally. X - Palate elevates symmetrically. XI - Chin turning & shoulder shrug intact bilaterally. XII - Tongue protrusion intact.  Motor Strength - The patient's strength was 5-/5 in upper extremities, 4/5 LLE but 2/5 RLE both proximal and distal and pronator drift was absent.  Bulk was decreased in all extremities and fasciculations were absent.   Motor Tone - Muscle tone was assessed at the neck and appendages and was normal.  Reflexes - The patient's reflexes were 1+ in all extremities and she had no pathological reflexes.  Sensory - Light touch, temperature/pinprick were assessed and were  normal.    Coordination - The patient had normal movements in the hands with no ataxia or dysmetria.  Tremor was absent.  Gait and Station - not able to test.  ASSESSMENT/PLAN Ms. Amber Hodges is a 80 y.o. female with history of afib not on [facility] and previous left temporal stroke in 07/2014 was admitted for acute onset dysarthria and left LE weakness. S/p TPA and dysarthria slightly better.    Stroke:  Right hemisphere embolic stroke s/p IV tPA, may involve right MCA and ACA territory. Pt refused MRI due to claustrophobia. CT no acute findings except left temporal infarct in 07/2014.   Resultant  Left facial droop, dysarthria and left LE weakness.  MRI and MRA not done as pt refused due to claustrophia  Carotid Doppler  unremarkable  2D Echo  unremarkable  Repeat CT did not show any bleeding or  obvious acute infarct. Old left temporal infarct present  LDL 96, not at goal  HgbA1c 5.2, at the goal  eliquis for VTE prophylaxis  DIET - DYS 1   no antithrombotic prior to admission, now on eliquis (apixaban) 2.5mg  bid  Patient counseled to be compliant with her antithrombotic medications  Ongoing aggressive stroke risk factor management  Therapy recommendations: Home health PT and OT recommended  Disposition:  Pending  Afib  Pt was not on eliquis since last discharge as daughter fear of bleeding  Rate controlled now  After discussion with son and daughter, will put on eliquis 2.5mg  bid  Family understand that pt on eliquis has higher risk of bleeding and the bleeding could be anywhere, however, after weighing risk and benefit, family agree with anticoagulation with eliquis.  Continue home meds with digoxin and lasix  Hypertension  Home meds:   Ramipril and isradipine  Stable Permissive hypertension (OK if <220/120) for 24-48 hours post stroke and then gradually normalized within 5-7 days. Hold off ramipril and isradipine for now.  Patient counseled to be  compliant with her blood pressure medications  Hyperlipidemia  Home meds:  None  LDL 96, goal < 70  Add pravastatin 10mg  daily  Continue statin at discharge  Left LE pain - X-ray ruled out fracture - venous doppler negative for DVT - family stated that pt has it for about 2 weeks and getting better - ice pack - close monitor  Other Stroke Risk Factors  Advanced age  Hx stroke/TIA - 07/2014 left temporal stroke  Other Active Problems  Dysarthria - passed swallow  Plan discharge as soon as home needs can be arranged as discussed with care manager.  Hospital day # 3   Personally examined patient and images, and have documentes history, physical, neuro exam,assessment and plan as stated above.   Naomie DeanAntonia Ahern, MD Stroke Neurology [phone] [facility]    [facility]       To contact Stroke Continuity provider, please refer to [email or web address]. After hours, contact General Neurology

## 2014-12-18 NOTE — Evaluation (Signed)
Physical Therapy Evaluation Patient Details Name: Amber Hodges MRN: [id number] DOB: 02/24/1919 Today's Date: 12/18/2014   History of Present Illness  80 y.o. female with a history of afib and previous stroke who presents with acute right sided weakness/dysarthria   Clinical Impression  Pt admitted with above diagnosis. Pt currently with functional limitations due to the deficits listed below (see PT Problem List).  Pt will benefit from skilled PT to increase their independence and safety with mobility to allow discharge to the venue listed below.  Spoke at length with pt's daughter reguarding patient's ability to tolerate PT.  Pt and daugther, adamant that they wish to try HHPT for return to ambulatory status.  Recommend hoyer lift for home as pt is total assist for transfers and at high risk for falls.     Follow Up Recommendations Home health PT;Supervision/Assistance - 24 hour    Equipment Recommendations  Other (comment) (hoyer lift)    Recommendations for Other Services       Precautions / Restrictions Precautions Precautions: Fall;Other (comment) (skin breakdown)      Mobility  Bed Mobility   Bed Mobility: Supine to Sit;Sit to Supine     Supine to sit: Total assist Sit to supine: Total assist      Transfers Overall transfer level: Needs assistance Equipment used: Rolling walker (2 wheeled) Transfers: Sit to/from Stand Sit to Stand: Total assist         General transfer comment: attempted sit to stand from EOB.  Pt unable to bear weight through BLE requiring return to supine.  Ambulation/Gait                Stairs            Wheelchair Mobility    Modified Rankin (Stroke Patients Only)       Balance Overall balance assessment: Needs assistance Sitting-balance support: No upper extremity supported;Feet supported Sitting balance-Leahy Scale: Poor   Postural control: Posterior lean                                    Pertinent Vitals/Pain Pain Assessment: Faces Faces Pain Scale: Hurts even more Pain Location: R leg    Home Living Family/patient expects to be discharged to:: Private residence Living Arrangements: Other relatives;Non-relatives/Friends Available Help at Discharge: Available 24 hours/day;Personal care attendant Type of Home: House Home Access: Stairs to enter Entrance Stairs-Rails: Left;Right;Can reach both Entrance Stairs-Number of Steps: 2 Home Layout: One level Home Equipment: Walker - 2 wheels;Transport chair      Prior Function Level of Independence: Independent with assistive device(s);Needs assistance   Gait / Transfers Assistance Needed: Family reports pt is typically mod (I) with bed mobility skills, transfers, and household amb with use of rollator.   ADL's / Homemaking Assistance Needed: Pt assisted with bathing/dressing until @ 2weeks ago. Ambulated to bathroom @ mod I level  Comments: Was ambulating  with RW until @ 2 weeks ago and now using a w/c for mobility. No bed - w/c - sofa - w/c - bed     Hand Dominance   Dominant Hand: Right    Extremity/Trunk Assessment               Lower Extremity Assessment: Generalized weakness;RLE deficits/detail RLE Deficits / Details: R knee and ankle contractures, no change from baseline    Cervical / Trunk Assessment: Kyphotic  Communication   Communication: Expressive difficulties  Cognition Arousal/Alertness: Awake/alert Behavior During Therapy: WFL for tasks assessed/performed Overall Cognitive Status: History of cognitive impairments - at baseline                      General Comments      Exercises        Assessment/Plan    PT Assessment Patient needs continued PT services  PT Diagnosis Generalized weakness;Difficulty walking   PT Problem List Decreased strength;Decreased activity tolerance;Decreased balance;Decreased mobility;Pain  PT Treatment Interventions DME  instruction;Functional mobility training;Therapeutic activities;Therapeutic exercise;Patient/family education;Balance training   PT Goals (Current goals can be found in the Care Plan section) Acute Rehab PT Goals Patient Stated Goal: to go home and die PT Goal Formulation: With patient/family Time For Goal Achievement: 01/01/15 Potential to Achieve Goals: Fair    Frequency Min 2X/week   Barriers to discharge        Co-evaluation               End of Session Equipment Utilized During Treatment: Gait belt Activity Tolerance: Patient limited by fatigue Patient left: in bed;with family/visitor present;with call bell/phone within reach;with bed alarm set Nurse Communication: Mobility status;Need for lift equipment         Time: [id number] PT Time Calculation (min) (ACUTE ONLY): 29 min   Charges:   PT Evaluation $Initial PT Evaluation Tier I: 1 Procedure PT Treatments $Therapeutic Activity: 23-37 mins   PT G Codes:        Ilda FoilGarrow, Amber Hodges 12/18/2014, 11:40 AM

## 2014-12-18 NOTE — Care Management Note (Addendum)
    Page 1 of 2   12/18/2014     4:51:35 PM CARE MANAGEMENT NOTE 12/18/2014  Patient:  Amber Hodges, Amber Hodges   Account Number:  [id number]  Date Initiated:  12/18/2014  Documentation initiated by:  [facility]  Subjective/Objective Assessment:   adm: acute right sided weakness/dysarthria       Action/Plan:   discharge plannning   Anticipated DC Date:  12/19/2014   Anticipated DC Plan:  [place]  CM consult      [facility] Choice  HOME HEALTH   Choice offered to / List presented to:  C-4 Adult Children   DME arranged  [place]      DME agency  [place] arranged  HH-1 RN  [place].   Status of service:  Completed, signed off Medicare Important Message given?   (If response is "NO", the following Medicare IM given date fields will be blank) Date Medicare IM given:   Medicare IM given by:   Date Additional Medicare IM given:   Additional Medicare IM given by:    Discharge Disposition:  [place]  Per UR Regulation:    If discussed at Long Length of Stay Meetings, dates discussed:    Comments:  12/18/14 16:30 CM met with pt and caretaker Amber Hodges, [phone] in room to offer choice of home health agency. Family chooses [facility] as they have used them in the past to render HHPT/OT/RN/SLP.  Cm called son of pt, Amber Hodges [phone] and he verifies choice and states Amber Hodges is also a membe of the family and can speak for family and pt.  CM called DME rep, Jeneen Rinks to please arrange delivery of DME ASAP.  Referral called to [facility] rep, Stephanie.  No other CM needs were communicated.  Mariane Masters, BSN, CM [phone].

## 2014-12-19 DIAGNOSIS — I639 Cerebral infarction, unspecified: Secondary | ICD-10-CM

## 2014-12-19 NOTE — Progress Notes (Signed)
STROKE TEAM PROGRESS NOTE   HISTORY Amber Hodges is a 80 y.o. female with a history of afib and previous stroke who presents with acute right sided weakness/dysarthria starting earlier today. She was eating breakfast and had a witnessed change at 8:50am. She went to [facility] adn had tpa administered  Also of note, she has not been walking for the past week due to right ankle pain.   I discussed code status with the patient and she states that if something were to happen she would in no circumstance want a breathign tube, but would want to "die and go to heaven."   LKW: 8:50 am  tpa given?: yes  SUBJECTIVE (INTERVAL HISTORY) Daughter is at the bedside. She says they would like to bring patient home as she has 24x7 care. Patient says she wants to go back home. PT has recommended a hoyer lift, hospital bed and wheelchair and case management is in the process of getting it ready hopefully tomorrow.    OBJECTIVE Temp:  [97.6 F (36.4 C)-98 F (36.7 C)] 97.8 F (36.6 C) (12/27 0930) Pulse Rate:  [70-85] 77 (12/27 0930) Cardiac Rhythm:  [-] Atrial fibrillation (12/26 2000) Resp:  [16-18] 18 (12/27 0930) BP: (134-154)/(68-80) 137/69 mmHg (12/27 0930) SpO2:  [93 %-96 %] 94 % (12/27 0930)  No results for input(s): GLUCAP in the last 168 hours.  Recent Labs Lab 12/15/14 0948 12/15/14 0951 12/15/14 1016  NA  --  134* 135  K  --  4.6 4.4  CL  --  97 97  CO2  --   --  30  GLUCOSE  --  103* 97  BUN  --  18 18  CREATININE 0.90 1.00 0.82  CALCIUM  --   --  9.1    Recent Labs Lab 12/15/14 1016  AST 46*  ALT 25  ALKPHOS 259*  BILITOT 0.9  PROT 7.3  ALBUMIN 3.8    Recent Labs Lab 12/15/14 0951 12/15/14 1016  WBC  --  6.5  NEUTROABS  --  3.4  HGB 14.3 14.0  HCT 42.0 41.0  MCV  --  91.3  PLT  --  188   No results for input(s): CKTOTAL, CKMB, CKMBINDEX, TROPONINI in the last 168 hours. No results for input(s): LABPROT, INR in the last 72 hours. No results for  input(s): COLORURINE, LABSPEC, PHURINE, GLUCOSEU, HGBUR, BILIRUBINUR, KETONESUR, PROTEINUR, UROBILINOGEN, NITRITE, LEUKOCYTESUR in the last 72 hours.  Invalid input(s): APPERANCEUR     Component Value Date/Time   CHOL 160 12/16/2014 0218   TRIG 142 12/16/2014 0218   HDL 36* 12/16/2014 0218   CHOLHDL 4.4 12/16/2014 0218   VLDL 28 12/16/2014 0218   LDLCALC 96 12/16/2014 0218   Lab Results  Component Value Date   HGBA1C 5.0 12/16/2014      Component Value Date/Time   LABOPIA NONE DETECTED 08/22/2014 1249   COCAINSCRNUR NONE DETECTED 08/22/2014 1249   LABBENZ POSITIVE* 08/22/2014 1249   AMPHETMU NONE DETECTED 08/22/2014 1249   THCU NONE DETECTED 08/22/2014 1249   LABBARB NONE DETECTED 08/22/2014 1249     Recent Labs Lab 12/15/14 1016  ETH <5   I have personally reviewed the radiological images below and agree with the radiology interpretations.  Ct Head Wo Contrast 12/16/2014  IMPRESSION: Stable non contrast CT appearance of the brain compared to 02/15/2014.  No acute cortically based infarct or acute intracranial abnormality identified.    12/15/2014     IMPRESSION: Chronic changes as described.  No acute intracranial abnormality.    Dg Chest Port 1 View 12/15/2014   IMPRESSION: No active disease.  Mild left basilar atelectasis.      Dg Ankle Right Port 12/15/2014   IMPRESSION: 1. No acute fracture, malalignment or osseous abnormality. 2. Osteopenia.      CUS - Bilateral: 1-39% ICA stenosis. Vertebral artery flow is antegrade.  LE DVT - negative bilaterally  2D echo - Left ventricle: The cavity size was normal. Wall thickness was increased in a pattern of moderate LVH. Systolic function was normal. The estimated ejection fraction was in the range of 55% to 60%. The study was not technically sufficient to allow evaluation of LV diastolic dysfunction due to atrial fibrillation. - Aortic valve: Mildly calcified annulus. Trileaflet; mildly thickened  leaflets. There was moderate regurgitation. Valve area (VTI): 1.76 cm^2. Valve area (Vmax): 1.75 cm^2. - Mitral valve: Moderately calcified annulus. Mildly thickened leaflets . There was mild regurgitation. - Left atrium: The atrium was severely dilated. - Right atrium: The atrium was severely dilated. - Technically adequate study.  PHYSICAL EXAM  Temp:  [97.6 F (36.4 C)-98 F (36.7 C)] 97.8 F (36.6 C) (12/27 0930) Pulse Rate:  [70-85] 77 (12/27 0930) Resp:  [16-18] 18 (12/27 0930) BP: (134-154)/(68-80) 137/69 mmHg (12/27 0930) SpO2:  [93 %-96 %] 94 % (12/27 0930)  General - Well nourished, well developed, in no apparent distress.  Ophthalmologic - not able to see the fundi due to lack of cooperation.  Cardiovascular - irregularly irregular heart rate and rhythm.  Neuro: Exam remains unchanged/stable Mental Status -  Level of arousal and orientation to place, and person were intact, but not orientated to time. Language including expression, comprehension was assessed and found intact, however, severe dysarthria and naming and repetition not able to test.  Cranial Nerves II - XII - II - blink to threat bilaterally. III, IV, VI - Extraocular movements intact. V - Facial sensation intact bilaterally. VII - left facial droop. VIII - Hard of hearing & vestibular intact bilaterally. X - Palate elevates symmetrically. XI - Chin turning & shoulder shrug intact bilaterally. XII - Tongue protrusion intact.  Motor Strength - The patient's strength was 5-/5 in upper extremities, 4/5 LLE but 2/5 RLE both proximal and distal and pronator drift was absent.  Bulk was decreased in all extremities and fasciculations were absent.   Motor Tone - Muscle tone was assessed at the neck and appendages and was normal.  Reflexes - The patient's reflexes were 1+ in all extremities and she had no pathological reflexes.  Sensory - Light touch, temperature/pinprick were assessed and were normal.     Coordination - The patient had normal movements in the hands with no ataxia or dysmetria.  Tremor was absent.  Gait and Station - not able to test.  ASSESSMENT/PLAN Amber Hodges is a 80 y.o. female with [facility] and previous left temporal stroke in 07/2014 was admitted for acute onset dysarthria and left LE weakness. S/p TPA and dysarthria slightly better.    Stroke:  Right hemisphere embolic stroke s/p IV tPA, may involve right MCA and ACA territory. Pt refused MRI due to claustrophobia. CT no acute findings except left temporal infarct in 07/2014.   Resultant  Left facial droop, dysarthria and left LE weakness.  MRI and MRA not done as pt refused due to claustrophia  Carotid Doppler  unremarkable  2D Echo  unremarkable  Repeat CT did not show any bleeding or  obvious acute infarct. Old left temporal infarct present  LDL 96, not at goal  HgbA1c 5.2, at the goal  eliquis for VTE prophylaxis  DIET - DYS 1   no antithrombotic prior to admission, now on eliquis (apixaban) 2.5mg  bid  Patient counseled to be compliant with her antithrombotic medications  Ongoing aggressive stroke risk factor management  Therapy recommendations: Home health PT and OT recommended  Disposition:  Discharged home.  Afib  Pt was not on eliquis since last discharge as daughter fear of bleeding  Rate controlled now  After discussion with son and daughter, will put on eliquis 2.5mg  bid  Family understand that pt on eliquis has higher risk of bleeding and the bleeding could be anywhere, however, after weighing risk and benefit, family agree with anticoagulation with eliquis.  Continue home meds with digoxin and lasix  Hypertension  Home meds:   Ramipril and isradipine  Stable Permissive hypertension (OK if <220/120) for 24-48 hours post stroke and then gradually normalized within 5-7 days. Hold off ramipril and isradipine for now.  Patient counseled to be  compliant with her blood pressure medications  Hyperlipidemia  Home meds:  None  LDL 96, goal < 70  Add pravastatin 10mg  daily  Continue statin at discharge  Left LE pain - X-ray ruled out fracture - venous doppler negative for DVT - family stated that pt has it for about 2 weeks and getting better - ice pack - close monitor  Other Stroke Risk Factors  Advanced age  Hx stroke/TIA - 07/2014 left temporal stroke  Other Active Problems  Dysarthria - passed swallow  Plan discharge as soon as home needs can be arranged as discussed with care manager.  Spoke with care manager again today. Due to the holiday schedule equipment will not be delivered until Monday. I also spoke with the patient's son who agrees with Monday for discharge. Care manager will arrange transportation for the patient.  Hospital day # 4   Personally examined patient and images, and have documentes history, physical, neuro exam,assessment and plan as stated above.   Naomie DeanAntonia Ahern, MD Stroke Neurology [phone] [facility]    [facility]       To contact Stroke Continuity provider, please refer to [email or web address]. After hours, contact General Neurology

## 2014-12-20 DIAGNOSIS — R471 Dysarthria and anarthria: Secondary | ICD-10-CM | POA: Diagnosis present

## 2014-12-20 DIAGNOSIS — M79605 Pain in left leg: Secondary | ICD-10-CM

## 2014-12-20 DIAGNOSIS — E785 Hyperlipidemia, unspecified: Secondary | ICD-10-CM | POA: Diagnosis present

## 2014-12-20 DIAGNOSIS — I482 Chronic atrial fibrillation: Secondary | ICD-10-CM

## 2014-12-20 DIAGNOSIS — I6340 Cerebral infarction due to embolism of unspecified cerebral artery: Secondary | ICD-10-CM

## 2014-12-20 MED ORDER — PRAVASTATIN SODIUM 10 MG PO TABS: 10.0000 mg | ORAL_TABLET | Freq: Every day | ORAL | Status: AC

## 2014-12-20 MED ORDER — APIXABAN 2.5 MG PO TABS: 2.5000 mg | ORAL_TABLET | Freq: Two times a day (BID) | ORAL | Status: AC

## 2014-12-20 NOTE — Discharge Summary (Signed)
Stroke Discharge Summary  Patient ID: Amber Hodges   MRN: [id number]      DOB: 12-Jan-1919  Date of Admission: 12/15/2014 Date of Discharge: 12/20/2014  Attending Physician:  Marvel Plan, MD, Stroke MD  Consulting Physician(s):   Treatment Team:  Md Stroke, MD   Patient's PCP:  Cassell Smiles., MD  DISCHARGE DIAGNOSIS:  Principal Problem:   R MCA and ACA cerebral infarct d/t erebral embolism from atrial fibrillation Active Problems:   Essential hypertension, benign   Atrial fibrillation   Hypertension   Left temporal lobe infarction   Hyperlipidemia LDL goal <70   Lower extremity pain, left   Dysarthria   Past Medical History  Diagnosis Date  . Angina pectoris   . Hypertension   . Peripheral edema   . Stroke    History reviewed. No pertinent past surgical history.    Medication List    TAKE these medications        apixaban 2.5 MG Tabs tablet  Commonly known as:  ELIQUIS  Take 1 tablet (2.5 mg total) by mouth 2 (two) times daily.     clorazepate 15 MG tablet  Commonly known as:  TRANXENE  Take 15 mg by mouth 2 (two) times daily.     digoxin 0.25 MG tablet  Commonly known as:  LANOXIN  Take 0.25 mg by mouth daily.     furosemide 40 MG tablet  Commonly known as:  LASIX  Take 1 tablet (40 mg total) by mouth daily.     isradipine 5 MG capsule  Commonly known as:  DYNACIRC  Take 10 mg by mouth daily.     potassium chloride 10 MEQ tablet  Commonly known as:  K-DUR  Take 10 mEq by mouth 2 (two) times daily.     pravastatin 10 MG tablet  Commonly known as:  PRAVACHOL  Take 1 tablet (10 mg total) by mouth daily at 6 PM.     ramipril 10 MG capsule  Commonly known as:  ALTACE  Take 10 mg by mouth daily.        LABORATORY STUDIES CBC    Component Value Date/Time   WBC 6.5 12/15/2014 1016   RBC 4.49 12/15/2014 1016   HGB 14.0 12/15/2014 1016   HCT 41.0 12/15/2014 1016   PLT 188 12/15/2014 1016   MCV 91.3 12/15/2014 1016   MCH 31.2  12/15/2014 1016   MCHC 34.1 12/15/2014 1016   RDW 14.9 12/15/2014 1016   LYMPHSABS 2.3 12/15/2014 1016   MONOABS 0.7 12/15/2014 1016   EOSABS 0.1 12/15/2014 1016   BASOSABS 0.0 12/15/2014 1016   CMP    Component Value Date/Time   NA 135 12/15/2014 1016   K 4.4 12/15/2014 1016   CL 97 12/15/2014 1016   CO2 30 12/15/2014 1016   GLUCOSE 97 12/15/2014 1016   BUN 18 12/15/2014 1016   CREATININE 0.82 12/15/2014 1016   CALCIUM 9.1 12/15/2014 1016   PROT 7.3 12/15/2014 1016   ALBUMIN 3.8 12/15/2014 1016   AST 46* 12/15/2014 1016   ALT 25 12/15/2014 1016   ALKPHOS 259* 12/15/2014 1016   BILITOT 0.9 12/15/2014 1016   GFRNONAA 60* 12/15/2014 1016   GFRAA 69* 12/15/2014 1016   COAGS Lab Results  Component Value Date   INR 1.04 12/15/2014   INR 0.98 08/22/2014   Lipid Panel    Component Value Date/Time   CHOL 160 12/16/2014 0218   TRIG 142 12/16/2014 0218   HDL  36* 12/16/2014 0218   CHOLHDL 4.4 12/16/2014 0218   VLDL 28 12/16/2014 0218   LDLCALC 96 12/16/2014 0218   HgbA1C  Lab Results  Component Value Date   HGBA1C 5.0 12/16/2014   Cardiac Panel (last 3 results) No results for input(s): CKTOTAL, CKMB, TROPONINI, RELINDX in the last 72 hours. Urinalysis    Component Value Date/Time   COLORURINE YELLOW 08/22/2014 1249   APPEARANCEUR CLEAR 08/22/2014 1249   LABSPEC 1.010 08/22/2014 1249   PHURINE 6.0 08/22/2014 1249   GLUCOSEU NEGATIVE 08/22/2014 1249   HGBUR TRACE* 08/22/2014 1249   BILIRUBINUR NEGATIVE 08/22/2014 1249   KETONESUR NEGATIVE 08/22/2014 1249   PROTEINUR NEGATIVE 08/22/2014 1249   UROBILINOGEN 0.2 08/22/2014 1249   NITRITE NEGATIVE 08/22/2014 1249   LEUKOCYTESUR NEGATIVE 08/22/2014 1249   Urine Drug Screen     Component Value Date/Time   LABOPIA NONE DETECTED 08/22/2014 1249   COCAINSCRNUR NONE DETECTED 08/22/2014 1249   LABBENZ POSITIVE* 08/22/2014 1249   AMPHETMU NONE DETECTED 08/22/2014 1249   THCU NONE DETECTED 08/22/2014 1249   LABBARB  NONE DETECTED 08/22/2014 1249    Alcohol Level    Component Value Date/Time   ETH <5 12/15/2014 1016     SIGNIFICANT DIAGNOSTIC STUDIES  Ct Head Wo Contrast 12/16/2014  Stable non contrast CT appearance of the brain compared to 02/15/2014. No acute cortically based infarct or acute intracranial abnormality identified.  12/15/2014  Chronic changes as described. No acute intracranial abnormality.   Dg Chest Port 1 View 12/15/2014  No active disease. Mild left basilar atelectasis.   Dg Ankle Right Port 12/15/2014 IMPRESSION: 1. No acute fracture, malalignment or osseous abnormality. 2. Osteopenia.   CUS - Bilateral: 1-39% ICA stenosis. Vertebral artery flow is antegrade.  LE Dopplers - negative bilaterally  2D echo - Left ventricle: The cavity size was normal. Wall thickness was increased in a pattern of moderate LVH. Systolic function was normal. The estimated ejection fraction was in the range of 55%to 60%. The study was not technically sufficient to allowevaluation of LV diastolic dysfunction due to atrialfibrillation. - Aortic valve: Mildly calcified annulus. Trileaflet; mildly thickened leaflets. There was moderate regurgitation. Valve area (VTI): 1.76 cm^2. Valve area (Vmax): 1.75 cm^2. - Mitral valve: Moderately calcified annulus. Mildly thickened leaflets . There was mild regurgitation. - Left atrium: The atrium was severely dilated. - Right atrium: The atrium was severely dilated. - Technically adequate study.     HISTORY OF PRESENT ILLNESS [name] is a 80 y.o. female with a history of afib and previous stroke who presents with acute right sided weakness/dysarthria starting earlier today 12/15/2014. She was eating breakfast and had a witnessed change at 8:50am (LKW). [facility] and had tTA administered. Also of note, she has not been walking for the past week due to right ankle pain. I discussed code status with the patient  and she states that if something were to happen she would in no circumstance want a breathing tube, but would want to "die and go to heaven." [facility] and [facility] for further evaluation and treatment.   HOSPITAL COURSE The [facility] following IV TPA therapy for an acute right hemisphere embolic stroke. She was seen and admitted by Dr. Amada JupiterKirkpatrick and she seemed to be somewhat improved following the TPA. The patient was admitted to the neuro intensive care unit. She was evaluated by the speech, physical, and occupational therapists and  not felt to be a candidate for inpatient rehabilitation; however, it was felt that she would benefit from home health therapies. It was felt that her stroke was secondary to emboli from her atrial fibrillation. She had not been anticoagulated prior to admission. After discussing the risks and benefits with the family a decision was made to place the patient on low-dose Eliquis. Apparently she had been on Eliquis is some point in the past. The patient became very anxious to return home. The therapists recommended a hospital bed, a wheelchair and a Hoyer lift as well as continued PT, OT, and speech therapies. The care management team was consult at and arrangements were made to provide the recommended equipment. The patient was felt to be ready for discharge on Monday, 12/20/2014 in stable and improved condition.  Stroke: Right hemisphere embolic stroke s/p IV tPA, may involve right MCA and ACA territory. Pt refused MRI due to claustrophobia. CT no acute findings except left temporal infarct in 07/2014.   Resultant Left facial droop, dysarthria and left LE weakness.  MRI and MRA not done as pt refused due to claustrophia  Carotid Doppler unremarkable  2D Echo unremarkable  Repeat CT did not show any bleeding or obvious acute infarct. Old left temporal infarct present  HgbA1c 5.2, at  the goal  no antithrombotic prior to admission, started on eliquis (apixaban) 2.5mg  bid  Patient counseled to be compliant with her antithrombotic medications  Ongoing aggressive stroke risk factor management  Therapy recommendations: Home health PT and OT recommended  Disposition: home. Has 24h care at home. [facility]HC and equipment arravined  Afib  Pt was not on eliquis since last discharge as daughter fear of bleeding  Rate controlled   After discussion with son and daughter, will put on eliquis 2.5mg  bid  Family understand that pt on eliquis has higher risk of bleeding and the bleeding could be anywhere, however, after weighing risk and benefit, family agree with anticoagulation with eliquis.  Continue home meds with digoxin and lasix  Hypertension  Home meds: Ramipril and isradipine  Stable  ramipril and isradipine resumed at discharge  Patient counseled to be compliant with her blood pressure medications  Hyperlipidemia  Home meds: None  LDL 96, goal < 70  Added pravastatin 10mg  daily  Left LE pain  X-ray ruled out fracture  venous doppler negative for DVT  family stated that pt has it for about 2 weeks and getting better  Other Stroke Risk Factors  Advanced age  Hx stroke/TIA - 07/2014 left temporal stroke  Other Active Problems  Dysarthria - passed swallow  DISCHARGE EXAM Blood pressure 142/73, pulse 67, temperature 97.9 F (36.6 C), temperature source Oral, resp. rate 18, height 4\' 9"  (1.448 m), weight 43 kg (94 lb 12.8 oz), SpO2 95 %. General - Well nourished, well developed, in no apparent distress. Ophthalmologic - not able to see the fundi due to lack of cooperation. Cardiovascular - irregularly irregular heart rate and rhythm. Neuro: Exam remains unchanged/stable Mental Status -  Level of arousal and orientation to place, and person were intact, but not orientated to time. Language including expression, comprehension was assessed and  found intact, however, severe dysarthria and naming and repetition not able to test. Cranial Nerves II - XII - II - blink to threat bilaterally. III, IV, VI - Extraocular movements intact. V - Facial sensation intact bilaterally. VII - left facial droop. VIII - Hard of hearing & vestibular intact bilaterally. X - Palate elevates symmetrically. XI -  Chin turning & shoulder shrug intact bilaterally. XII - Tongue protrusion intact. Motor Strength - The patient's strength was 5-/5 in upper extremities, 4/5 LLE but 2/5 RLE both proximal and distal and pronator drift was absent. Bulk was decreased in all extremities and fasciculations were absent.  Motor Tone - Muscle tone was assessed at the neck and appendages and was normal. Reflexes - The patient's reflexes were 1+ in all extremities and she had no pathological reflexes. Sensory - Light touch, temperature/pinprick were assessed and were normal.  Coordination - The patient had normal movements in the hands with no ataxia or dysmetria. Tremor was absent. Gait and Station - not able to test.   Discharge Diet   DIET - DYS 1 honey thick liquids   DISCHARGE PLAN  Disposition:  Home with home health [facility]) and 24h care   eliquis (apixaban) for secondary stroke prevention.  Follow-up FUSCO,LAWRENCE J., MD in 2 weeks.  Follow-up with Dr. Marvel Plan in 2 months.  45 minutes were spent preparing discharge.  Rhoderick Moody [facility] See Amion for Pager information 12/20/2014 4:18 PM   I, the attending vascular neurologist, have personally obtained a history, examined the patient, evaluated laboratory data, individually viewed imaging studies and agree with radiology interpretations. Together with the NP/PA, we formulated the assessment and plan of care which reflects our mutual decision.  I have made any additions or clarifications directly to the above note and agree with the findings and plan as currently documented.   80  yo F with hx of afib not on AC presented with right hemisphere stroke symptoms. CT no acute abnormalities. Not able to have MRI done due to claustrophobic. Her stroke consistent with embolic stroke secondary to afib not on [facility]. After discussion with family and she was put on eliquis 2.5mg  bid for stroke prevention. She tolerated well. Her symptoms improved and was discharged in good condition with arranged HH. Follow up in clinic in 2 months.  Marvel Plan, MD PhD Stroke Neurology 12/20/2014 9:17 PM

## 2014-12-20 NOTE — Progress Notes (Signed)
Pt transported out of unit per stretcher by ambulance personnel. No acute distress noted. Daughter at side with pt's belongings.   Andrew AuVafiadis, Riven Mabile I 12/20/2014 7:43 PM

## 2014-12-20 NOTE — Progress Notes (Signed)
Discharge instructions and prescriptions given to pt. Pt verbally acknowledged understanding. Daughter at side. No questions voiced when prompted. Pt enrolled in EMMI.  Pt to be transported out of unit per ambulance. Will monitor.   Andrew AuVafiadis, Windell Musson I  12/20/2014 7:45 PM

## 2014-12-20 NOTE — Progress Notes (Signed)
Speech Language Pathology Treatment: Dysphagia  Patient Details Name: Amber Hodges MRN: [id number] DOB: 09/09/1919 Today's Date: 12/20/2014 Time: [id number] SLP Time Calculation (min) (ACUTE ONLY): 30 min  Assessment / Plan / Recommendation Clinical Impression  Skilled observation of po intake of honey-thickened liquids with modification of volume (1/2 tsp only) with double swallow and throat clearing cued throughout po intake d/t s/s of aspiration noted including wet vocal quality, throat clearing and intermittent double swallow noted; extensive patient/family education re: risk for aspiration and need for MBS as pt/family is not interested in non-oral feeding at this time; gave written information re: swallow precautions, diet recommendations and possiblity of OP MBS if family changes wishes re: completing the procedure; palpation of swallow indicated an audible, slow swallow response; pt is at moderate risk for aspiration with Dysphagia 1/honey-thickened liquids without modification of intake (ie: small 1/2 tsp amounts, double swallow, throat clearing intermittently) and family/pt have been made aware of this risk   HPI HPI: 80 y.o. female with a history of afib and previous stroke (8/15) who presents with acute right sided weakness/dysarthria starting earlier today. Transferred from [facility] where TPA had been admitted. Daughter-in-law states CT at [facility] was positve for CVA. MRI pending. Daughter-in-law reports frequent coughing during meals and a significant coughing episode Friday.  No prior ST swallow evals found.  CXR No active disease. Mild left basilar atelectasis.   Pertinent Vitals Pain Assessment: No/denies pain  SLP Plan  Continue with current plan of care    Recommendations Diet recommendations: Dysphagia 1 (puree);Honey-thick liquid;Other(comment) (with swallow precautions) Liquids provided via: Teaspoon Medication Administration: Crushed with puree Supervision: Full  supervision/cueing for compensatory strategies Compensations: Slow rate;Small sips/bites;Multiple dry swallows after each bite/sip Postural Changes and/or Swallow Maneuvers: Seated upright 90 degrees              General recommendations: Other(comment) (home health SLP) Oral Care Recommendations: Oral care BID Follow up Recommendations: Home health SLP Plan: Continue with current plan of care         ADAMS,PAT, M.S., CCC-SLP 12/20/2014, 11:27 AM

## 2014-12-20 NOTE — Discharge Instructions (Signed)

## 2014-12-20 NOTE — Progress Notes (Signed)
30 day free Eliquis coupon card given to the patient; Benefit check in progress for the co pay of eliquis and if prior authorization is needed; [facility]; Abelino DerrickB Kemo Spruce RN,BSN,MHA [phone]

## 2014-12-21 NOTE — Progress Notes (Signed)
BENEFIT CHECK FOR ELIQUIS -12/21/2014 1046 by MEGAN SHULAR CMA PER REP AT [facility]IC[facility]:  $64.00 AT RETAIL/ $192.00 FOR 90 DAY RETAIL  PATIENT CAN USE: [facility], [facility], [facility], [facility], [facility], [facility]

## 2015-01-24 DEATH — deceased

## 2016-04-04 IMAGING — CT CT HEAD W/O CM
1 series · 15 of 30 positions shown, 19 images · non-contrast
Comparison: Head CTs without contrast 12/15/2014 and earlier.

CLINICAL DATA: [AGE] female with right side weakness. Code
stroke patient. Initial encounter.

EXAM:
CT HEAD WITHOUT CONTRAST
TECHNIQUE: Contiguous axial images were obtained from the base of the skull
through the vertex without intravenous contrast.

[Series 2: head 5.0 h30s · axial · 0.39mm/px · z∈[+282,+448]mm · 15 of 37 slices shown, 19 images]
[im 2/37  brain]
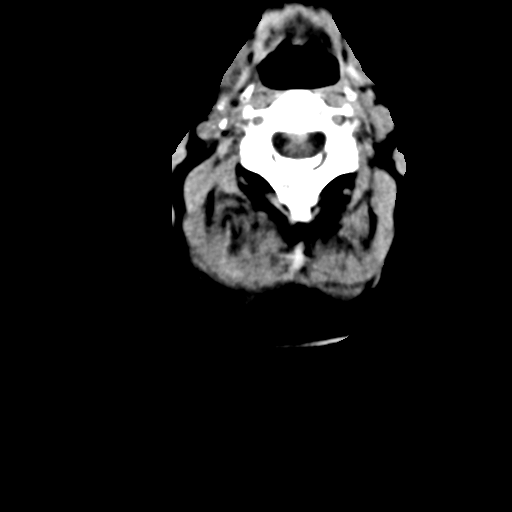
[im 2/37  bone]
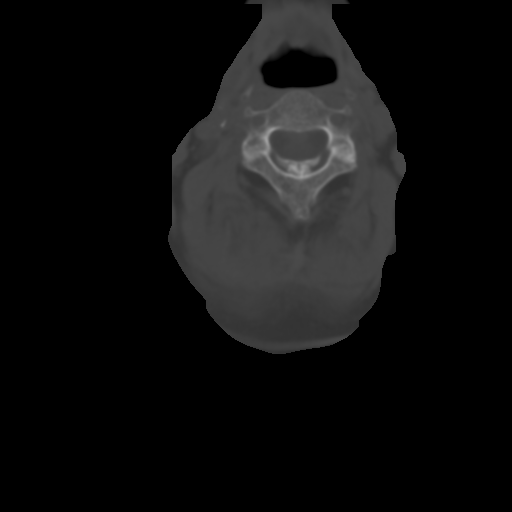
[im 4/37  brain]
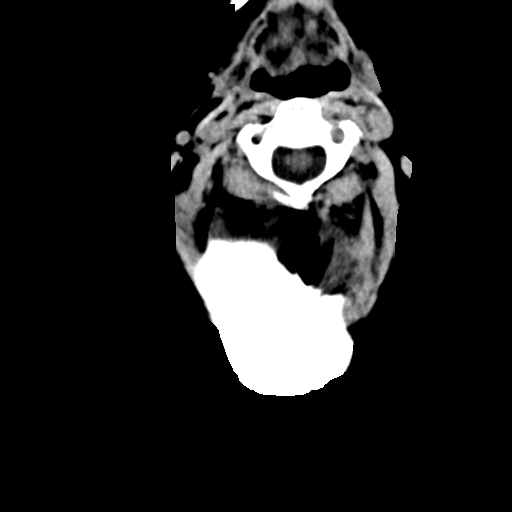
[im 7/37  brain]
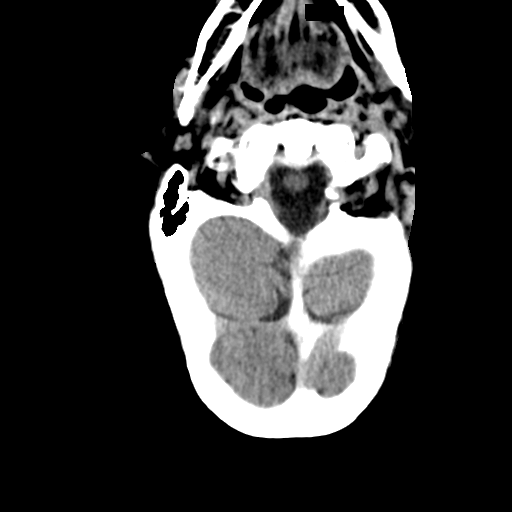
[im 9/37  brain]
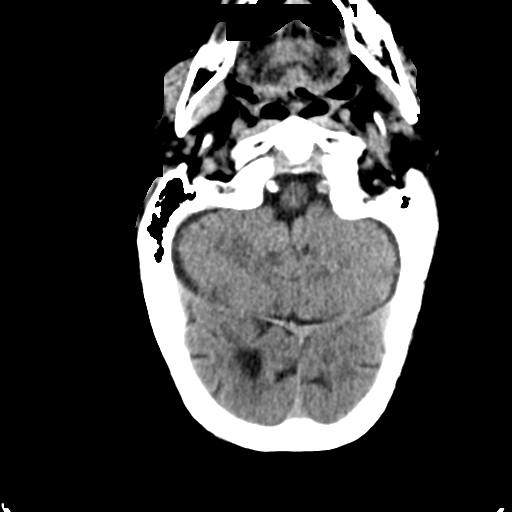
[im 12/37  brain]
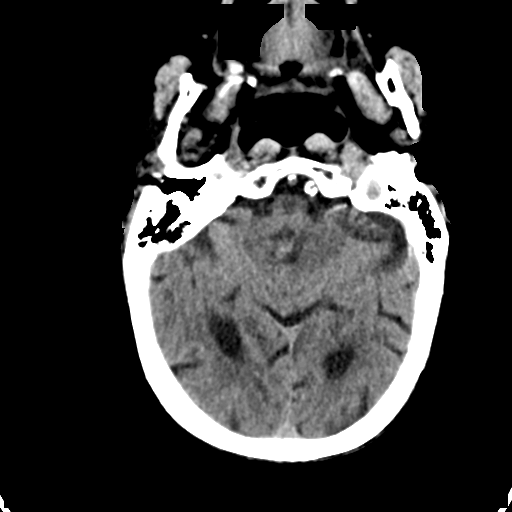
[im 12/37  bone]
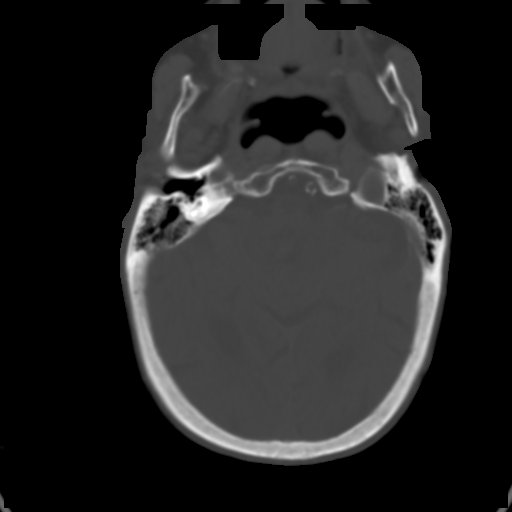
[im 14/37  brain]
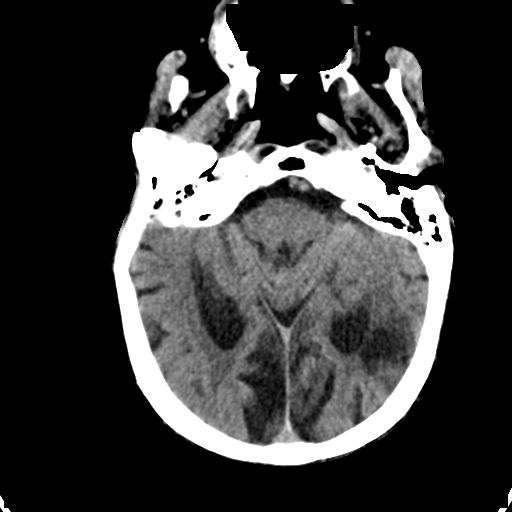
[im 17/37  brain]
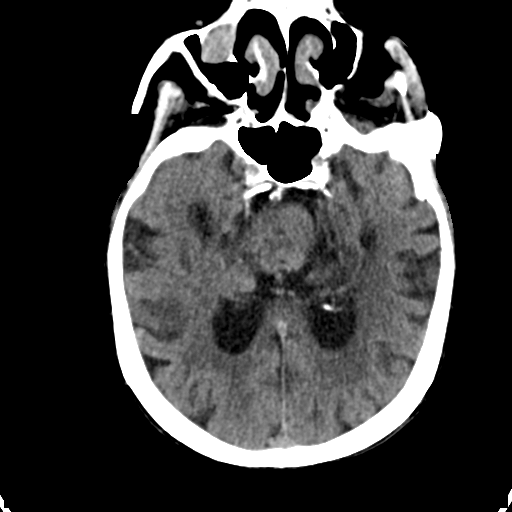
[im 19/37  brain]
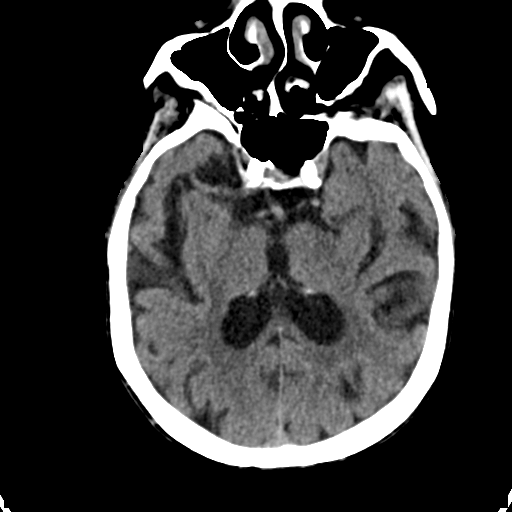
[im 20/37  brain]
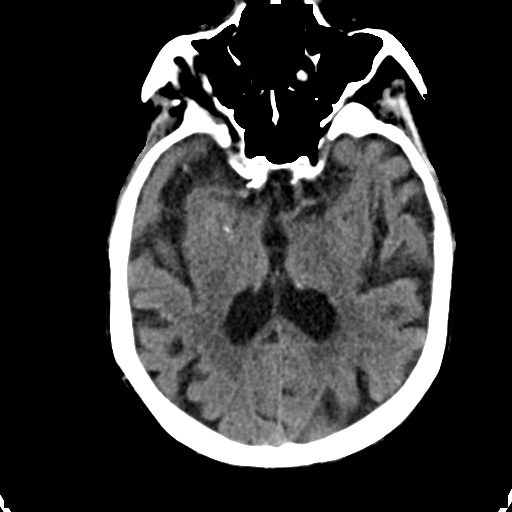
[im 20/37  bone]
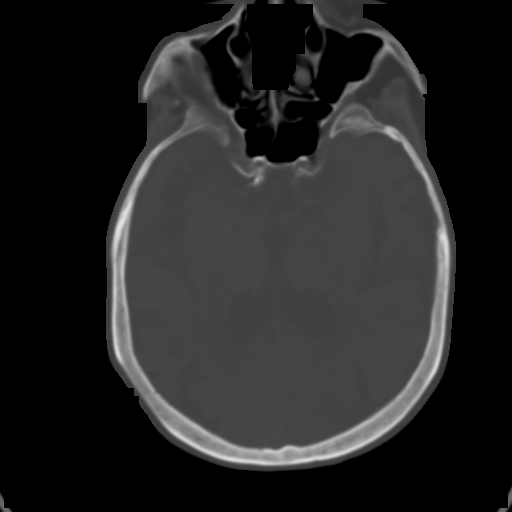
[im 23/37  brain]
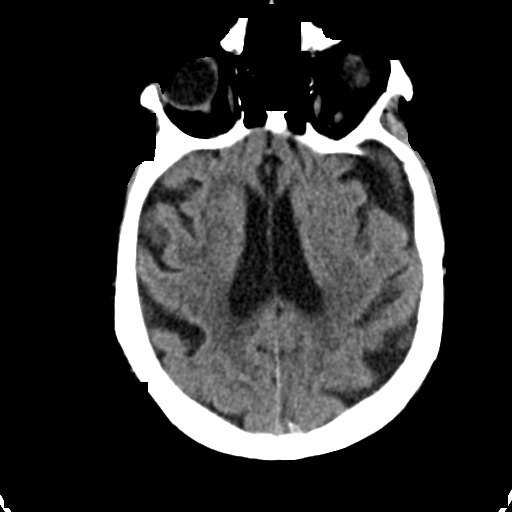
[im 25/37  brain]
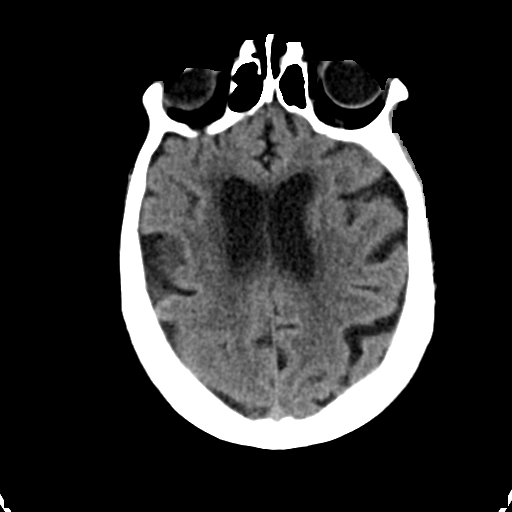
[im 28/37  brain]
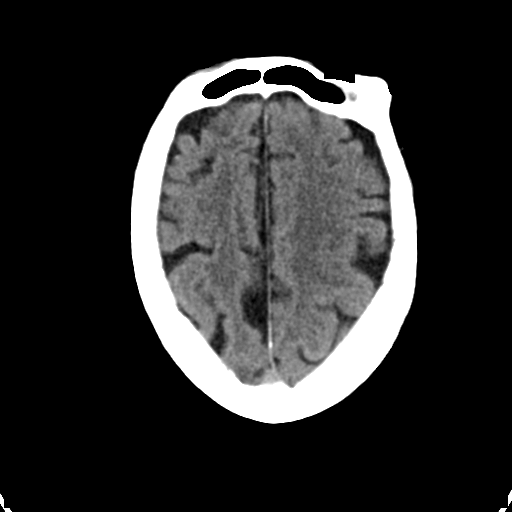
[im 30/37  brain]
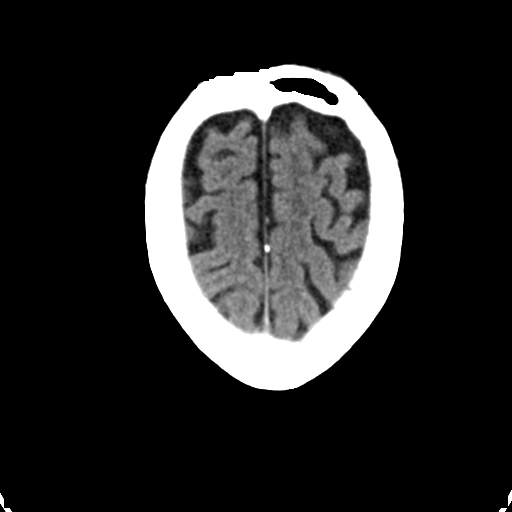
[im 30/37  bone]
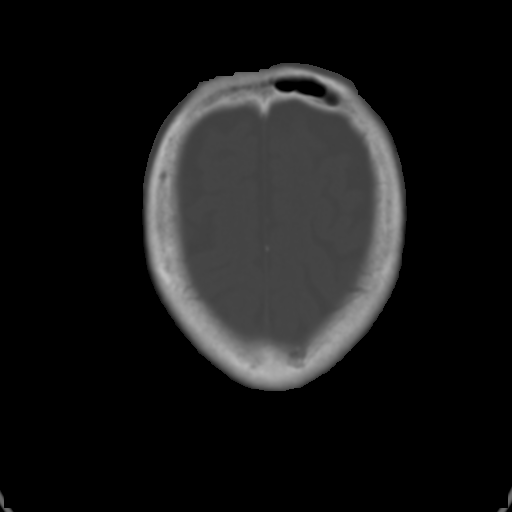
[im 33/37  brain]
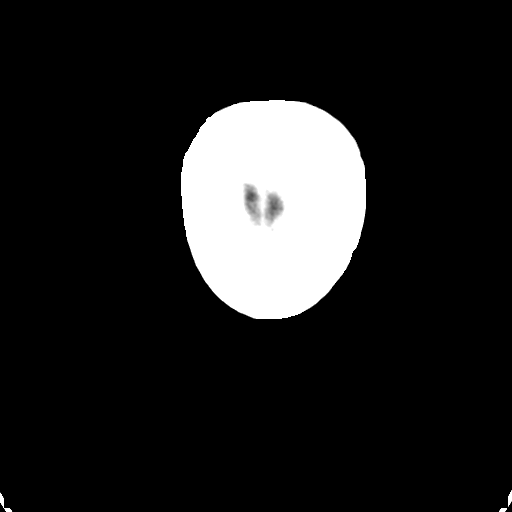
[im 35/37  brain]
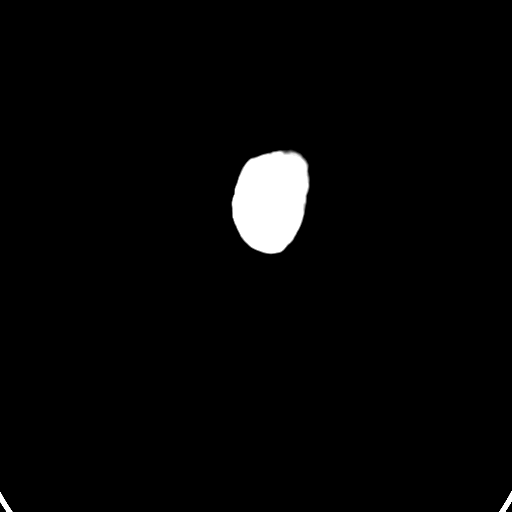

[15 of 30 positions shown; findings below may reference images not displayed]

FINDINGS: No acute osseous abnormality identified. No acute orbit or scalp
soft tissue finding identified. Right maxillary sinus mucous
retention cyst.

Extensive Calcified atherosclerosis at the skull base. No midline
shift, mass effect, or evidence of intracranial mass lesion. No
ventriculomegaly. Chronic encephalomalacia in the posterior left MCA
territory affecting the posterior left temporal lobe and some of the
lateral aspect of the left occipital lobe re - identified and
stable. No acute intracranial hemorrhage identified. No suspicious
intracranial vascular hyperdensity. No new cortically based infarct
identified. Stable gray-white matter differentiation elsewhere, with
mild for age white matter hypodensity
IMPRESSION: Stable non contrast CT appearance of the brain compared to
02/15/2014. No acute cortically based infarct or acute intracranial
abnormality identified.
